# Patient Record
Sex: Female | Born: 2000 | Race: Black or African American | Hispanic: No | Marital: Single | Smoking: Never smoker
Health system: Southern US, Community
[De-identification: ages and names within clinical notes are randomized; demographics above are authoritative.]

## PROBLEM LIST (undated history)

## (undated) ENCOUNTER — Ambulatory Visit: Source: Home / Self Care

## (undated) DIAGNOSIS — Z789 Other specified health status: Secondary | ICD-10-CM

## (undated) HISTORY — PX: WISDOM TOOTH EXTRACTION: SHX21

---

## 2020-07-23 ENCOUNTER — Other Ambulatory Visit: Payer: Self-pay

## 2020-10-21 ENCOUNTER — Inpatient Hospital Stay (HOSPITAL_COMMUNITY)
Admission: AD | Admit: 2020-10-21 | Discharge: 2020-10-21 | Disposition: A | Discharge status: Home / Self Care | Payer: BLUE CROSS/BLUE SHIELD | Attending: Obstetrics & Gynecology | Admitting: Obstetrics & Gynecology

## 2020-10-21 ENCOUNTER — Encounter (HOSPITAL_COMMUNITY): Payer: Self-pay | Admitting: Obstetrics & Gynecology

## 2020-10-21 ENCOUNTER — Inpatient Hospital Stay (HOSPITAL_COMMUNITY): Payer: BLUE CROSS/BLUE SHIELD

## 2020-10-21 ENCOUNTER — Other Ambulatory Visit: Payer: Self-pay

## 2020-10-21 DIAGNOSIS — R109 Unspecified abdominal pain: Secondary | ICD-10-CM | POA: Diagnosis not present

## 2020-10-21 DIAGNOSIS — O26891 Other specified pregnancy related conditions, first trimester: Secondary | ICD-10-CM | POA: Diagnosis not present

## 2020-10-21 DIAGNOSIS — Z3A01 Less than 8 weeks gestation of pregnancy: Secondary | ICD-10-CM | POA: Diagnosis not present

## 2020-10-21 DIAGNOSIS — O209 Hemorrhage in early pregnancy, unspecified: Secondary | ICD-10-CM | POA: Insufficient documentation

## 2020-10-21 DIAGNOSIS — O3680X0 Pregnancy with inconclusive fetal viability, not applicable or unspecified: Secondary | ICD-10-CM | POA: Diagnosis not present

## 2020-10-21 HISTORY — DX: Other specified health status: Z78.9

## 2020-10-21 LAB — URINALYSIS, ROUTINE W REFLEX MICROSCOPIC
Bilirubin Urine: NEGATIVE
Glucose, UA: NEGATIVE mg/dL
Ketones, ur: NEGATIVE mg/dL
Leukocytes,Ua: NEGATIVE
Nitrite: NEGATIVE
Protein, ur: NEGATIVE mg/dL
Specific Gravity, Urine: 1.027 (ref 1.005–1.030)
pH: 5 (ref 5.0–8.0)

## 2020-10-21 LAB — ABO/RH: ABO/RH(D): O POS

## 2020-10-21 LAB — POCT PREGNANCY, URINE: Preg Test, Ur: POSITIVE — AB

## 2020-10-21 LAB — CBC
HCT: 38.3 % (ref 36.0–46.0)
Hemoglobin: 12.8 g/dL (ref 12.0–15.0)
MCH: 29 pg (ref 26.0–34.0)
MCHC: 33.4 g/dL (ref 30.0–36.0)
MCV: 86.7 fL (ref 80.0–100.0)
Platelets: 302 10*3/uL (ref 150–400)
RBC: 4.42 MIL/uL (ref 3.87–5.11)
RDW: 12.7 % (ref 11.5–15.5)
WBC: 6.8 10*3/uL (ref 4.0–10.5)
nRBC: 0 % (ref 0.0–0.2)

## 2020-10-21 LAB — HCG, QUANTITATIVE, PREGNANCY: hCG, Beta Chain, Quant, S: 506 m[IU]/mL — ABNORMAL HIGH (ref <5)

## 2020-10-21 LAB — WET PREP, GENITAL
Clue Cells Wet Prep HPF POC: NONE SEEN
Sperm: NONE SEEN
Trich, Wet Prep: NONE SEEN
Yeast Wet Prep HPF POC: NONE SEEN

## 2020-10-21 LAB — HIV ANTIBODY (ROUTINE TESTING W REFLEX): HIV Screen 4th Generation wRfx: NONREACTIVE

## 2020-10-21 NOTE — MAU Note (Signed)
Presents with c/o lower abdominal pain and VB.  Reports saturating 2 sanitary napkins per day.  LMP approx 09/11/2020.  +HPT.

## 2020-10-21 NOTE — MAU Provider Note (Addendum)
History    Patient Name: Kerry Peters CSN: [id number]  Arrival date and time: 10/21/20 6060   Event Date/Time   First Provider Initiated Contact with Patient 10/21/20 1112      Chief Complaint  Patient presents with   Abdominal Pain   Vaginal Bleeding   HPI Patient is a 20yof G1P0 [redacted]w[redacted]d by LMP presenting with abdominal pain and vaginal bleeding. She states that she has been bleeding since for the past 16 days. She states that the amount of flow has gotten larger and she is now using two pads a day. She has had abdominal pain for the past week. She notes the pain as 4/10 and waxes and wanes. The pain does not radiate. She has taken tylenol for the pain with relief. Denies fever, hematuria, dysuria and vaginal discharge.  OB History     Gravida  1   Para      Term      Preterm      AB      Living         SAB      IAB      Ectopic      Multiple      Live Births              Past Medical History:  Diagnosis Date   Medical history non-contributory     Past Surgical History:  Procedure Laterality Date   WISDOM TOOTH EXTRACTION      Family History  Problem Relation Age of Onset   Hypertension Mother    Liver disease Father     Social History   Tobacco Use   Smoking status: Never Smoker   Smokeless tobacco: Never Used  Vaping Use   Vaping Use: Former  Substance Use Topics   Alcohol use: Not Currently    Comment: Socially   Drug use: Never    Allergies: No Known Allergies  Medications Prior to Admission  Medication Sig Dispense Refill Last Dose   cetirizine (ZYRTEC) 10 MG tablet Take 10 mg by mouth daily.   10/19/2020    Review of Systems  Constitutional: Negative for fever.  Gastrointestinal: Positive for abdominal pain.  Genitourinary: Positive for vaginal bleeding. Negative for dysuria and vaginal discharge.   Physical Exam   Blood pressure 119/80, pulse 70, temperature 97.6 F (36.4 C), temperature source Oral, resp. rate 20, height  5\' 1"  (1.549 m), weight 70.7 kg, last menstrual period 09/11/2020, SpO2 100 %.  Physical Exam Vitals and nursing note reviewed. Exam conducted with a chaperone present.  HENT:     Head: Normocephalic.  Cardiovascular:     Rate and Rhythm: Normal rate and regular rhythm.  Pulmonary:     Effort: Pulmonary effort is normal.  Abdominal:     Tenderness: There is no abdominal tenderness.  Genitourinary:    Cervix: Cervical bleeding present.  Neurological:     General: No focal deficit present.     Mental Status: She is alert.  Psychiatric:        Mood and Affect: Mood normal.        Behavior: Behavior normal.   Pelvic exam: Cervix pink, visually closed, without lesion, small amount dark red bleeding, no fox swabs used to visualize cervix. Vaginal walls and external genitalia normal.    MAU Course  Procedures  09/13/2020 OB LESS THAN 14 WEEKS WITH OB TRANSVAGINAL  Result Date: 10/21/2020 CLINICAL DATA:  Vaginal bleeding EXAM: OBSTETRIC <14 WK 12/21/2020 AND TRANSVAGINAL OB  US TECHNIQUE: Both transabdominal and transvaginal ultrasound examinations were performed for complete evaluation of the gestation as well as the maternal uterus, adnexal regions, and pelvic cul-de-sac. Transvaginal technique was performed to assess early pregnancy. COMPARISON:  None. FINDINGS: Intrauterine gestational sac: None Yolk sac:  Not Visualized. Embryo:  Not Visualized. Cardiac Activity: Not Visualized. Subchorionic hemorrhage:  None visualized. Maternal uterus/adnexae: No adnexal mass. Normal ovaries. Normal uterus. Small amount of pelvic free fluid likely physiologic. IMPRESSION: No intrauterine pregnancy is identified.  Correlate with beta HCG. Electronically Signed   By: Elige Ko   On: 10/21/2020 13:19   Results for orders placed or performed during the hospital encounter of 10/21/20 (from the past 72 hour(s))  Pregnancy, urine POC     Status: Abnormal   Collection Time: 10/21/20 10:12 AM  Result Value Ref Range   Preg  Test, Ur POSITIVE (A) NEGATIVE    Comment:        THE SENSITIVITY OF THIS METHODOLOGY IS >24 mIU/mL   Urinalysis, Routine w reflex microscopic Urine, Clean Catch     Status: Abnormal   Collection Time: 10/21/20 10:16 AM  Result Value Ref Range   Color, Urine YELLOW YELLOW   APPearance CLEAR CLEAR   Specific Gravity, Urine 1.027 1.005 - 1.030   pH 5.0 5.0 - 8.0   Glucose, UA NEGATIVE NEGATIVE mg/dL   Hgb urine dipstick LARGE (A) NEGATIVE   Bilirubin Urine NEGATIVE NEGATIVE   Ketones, ur NEGATIVE NEGATIVE mg/dL   Protein, ur NEGATIVE NEGATIVE mg/dL   Nitrite NEGATIVE NEGATIVE   Leukocytes,Ua NEGATIVE NEGATIVE   RBC / HPF 6-10 0 - 5 RBC/hpf   WBC, UA 0-5 0 - 5 WBC/hpf   Bacteria, UA RARE (A) NONE SEEN   Squamous Epithelial / LPF 0-5 0 - 5   Mucus PRESENT     Comment: Performed at [facility], [address], [place], [place] [address]  ABO/Rh     Status: None   Collection Time: 10/21/20 11:55 AM  Result Value Ref Range   ABO/RH(D) O POS    No rh immune globuloin      NOT A RH IMMUNE GLOBULIN CANDIDATE, PT RH POSITIVE Performed at [facility], [address] [place], [place] [address]   CBC     Status: None   Collection Time: 10/21/20 11:58 AM  Result Value Ref Range   WBC 6.8 4.0 - 10.5 K/uL   RBC 4.42 3.87 - 5.11 MIL/uL   Hemoglobin 12.8 12.0 - 15.0 g/dL   HCT 43.3 29.5 - 18.8 %   MCV 86.7 80.0 - 100.0 fL   MCH 29.0 26.0 - 34.0 pg   MCHC 33.4 30.0 - 36.0 g/dL   RDW 41.6 60.6 - 30.1 %   Platelets 302 150 - 400 K/uL   nRBC 0.0 0.0 - 0.2 %    Comment: Performed at [facility], [address], [place], [place] [address]  hCG, quantitative, pregnancy     Status: Abnormal   Collection Time: 10/21/20 11:58 AM  Result Value Ref Range   hCG, Beta Chain, Quant, S 506 (H) <5 mIU/mL    Comment:          GEST. AGE      CONC.  (mIU/mL)   <=1 WEEK        5 - 50     2 WEEKS       50 - 500     3 WEEKS  100 - 10,000     4 WEEKS     1,000 - 30,000     5  WEEKS     3,500 - 115,000   6-8 WEEKS     12,000 - 270,000    12 WEEKS     15,000 - 220,000        FEMALE AND NON-PREGNANT FEMALE:     LESS THAN 5 mIU/mL Performed at [facility], [address] [place], [place] [address]   Wet prep, genital     Status: Abnormal   Collection Time: 10/21/20 12:00 PM   Specimen: PATH Cytology Cervicovaginal Ancillary Only  Result Value Ref Range   Yeast Wet Prep HPF POC NONE SEEN NONE SEEN   Trich, Wet Prep NONE SEEN NONE SEEN   Clue Cells Wet Prep HPF POC NONE SEEN NONE SEEN   WBC, Wet Prep HPF POC FEW (A) NONE SEEN   Sperm NONE SEEN     Comment: Performed at [facility], [address] [place], [place] [address]    MDM Patient is a 25yof G1P0 [redacted]w[redacted]d by LMP presenting with abdominal pain and vaginal bleeding. She is sitting up in no acute distress. [place] protocol, GC and wet prep ordered. U/S showed no IUP. Beta quant is elevated. Unable to r/o ectopic, IUP or SAB. Pregnancy of unknown location determined. Will need repeat beta quant level in two days to trend.   Assessment and Plan   Pregnancy of Unknown Location  -- Beta quant elevated  -- Rh positive  -- CBC unremarkable  -- U/S: no IUP seen  -- F/u in 2 days for repeat beta quant, expressed the importance of this appt  -- Strict ectopic and SAB return precautions given  -- Tylenol PRN for pain   Patient is agreeable to plan and all questions were answered.   Jeoffrey Massed, PA-Student 10/21/2020, 11:21 AM   CNM attestation:  I have seen and examined this patient; I agree with above documentation in the PA student's note.   Kerry Peters is a 20 y.o. G1P0 reporting vaginal bleeding x 16  Denies VB, cramping, urinary symptoms, vaginal itching/burning.  PE: BP (!) 105/55 (BP Location: Right Arm)   Pulse 66   Temp 97.6 F (36.4 C) (Oral)   Resp 18   Ht 5\' 1"  (1.549 m)   Wt 155 lb 14.4 oz (70.7 kg)   LMP 09/11/2020   SpO2 99%   BMI 29.46 kg/m  Gen: calm  comfortable, NAD Resp: normal effort, no distress Abd: soft, nontender  ROS, labs, PMH reviewed   Plan: D/C home Pregnancy of unknown location, return in 48 hours for repeat hcg, sooner with worsening symptoms Return to MAU as needed for emergencies   09/13/2020, CNM 8:32 PM

## 2020-10-22 LAB — GC/CHLAMYDIA PROBE AMP (~~LOC~~) NOT AT ARMC
Chlamydia: NEGATIVE
Comment: NEGATIVE
Comment: NORMAL
Neisseria Gonorrhea: NEGATIVE

## 2020-10-23 ENCOUNTER — Inpatient Hospital Stay (HOSPITAL_COMMUNITY)
Admission: AD | Admit: 2020-10-23 | Discharge: 2020-10-23 | Disposition: A | Discharge status: Home / Self Care | Payer: BLUE CROSS/BLUE SHIELD | Attending: Obstetrics & Gynecology | Admitting: Obstetrics & Gynecology

## 2020-10-23 ENCOUNTER — Other Ambulatory Visit: Payer: Self-pay

## 2020-10-23 DIAGNOSIS — Z3A01 Less than 8 weeks gestation of pregnancy: Secondary | ICD-10-CM | POA: Diagnosis not present

## 2020-10-23 DIAGNOSIS — O3680X0 Pregnancy with inconclusive fetal viability, not applicable or unspecified: Secondary | ICD-10-CM | POA: Insufficient documentation

## 2020-10-23 LAB — HCG, QUANTITATIVE, PREGNANCY: hCG, Beta Chain, Quant, S: 495 m[IU]/mL — ABNORMAL HIGH (ref <5)

## 2020-10-23 NOTE — MAU Provider Note (Signed)
History   Chief Complaint:  Follow-up   Kerry Peters is  20 y.o. G1P0 Patient's last menstrual period was 09/11/2020.Marland Kitchen Patient is here for follow up of quantitative HCG and ongoing surveillance of pregnancy status. She is [redacted]w[redacted]d weeks gestation  by LMP.    Since her last visit, the patient is without new complaint. The patient reports bleeding as  lighter than period.  She reports improvement in pain since visit 2 days ago.    Her previous Quantitative HCG values are: 506 on 4/6 Ultrasound showed no IUP or adnexal masses.  Physical Exam   Blood pressure 111/62, pulse 76, temperature 98.1 F (36.7 C), temperature source Oral, resp. rate 16, height 5\' 1"  (1.549 m), weight 71 kg, last menstrual period 09/11/2020, SpO2 97 %.  Focused Gynecological Exam: examination not indicated  Physical Examination: General appearance - alert, well appearing, and in no distress Mental status - normal mood, behavior, speech, dress, motor activity, and thought processes Chest - normal respiratory effort  Labs: Results for orders placed or performed during the hospital encounter of 10/23/20 (from the past 24 hour(s))  hCG, quantitative, pregnancy   Collection Time: 10/23/20  1:11 PM  Result Value Ref Range   hCG, Beta Chain, Quant, S 495 (H) <5 mIU/mL    Ultrasound Studies:   12/23/20 OB LESS THAN 14 WEEKS WITH OB TRANSVAGINAL  Result Date: 10/21/2020 CLINICAL DATA:  Vaginal bleeding EXAM: OBSTETRIC <14 WK 12/21/2020 AND TRANSVAGINAL OB US TECHNIQUE: Both transabdominal and transvaginal ultrasound examinations were performed for complete evaluation of the gestation as well as the maternal uterus, adnexal regions, and pelvic cul-de-sac. Transvaginal technique was performed to assess early pregnancy. COMPARISON:  None. FINDINGS: Intrauterine gestational sac: None Yolk sac:  Not Visualized. Embryo:  Not Visualized. Cardiac Activity: Not Visualized. Subchorionic hemorrhage:  None visualized. Maternal uterus/adnexae: No  adnexal mass. Normal ovaries. Normal uterus. Small amount of pelvic free fluid likely physiologic. IMPRESSION: No intrauterine pregnancy is identified.  Correlate with beta HCG. Electronically Signed   By: [place]   On: 10/21/2020 13:19    Assessment:   1. Pregnancy of unknown anatomic location   -inappropriate change in HCG. Reviewed with Dr. 12/21/2020 who recommends repeat HCG on Monday    Plan: -Discharge home in stable condition -SAB vs ectopic precautions discussed -Patient advised to follow-up in MAU for repeat HCG on Monday (can't go to office due to school) -Patient may return to MAU as needed or if her condition were to change or worsen  Monday, NP 10/23/2020, 5:28 PM

## 2020-10-23 NOTE — MAU Note (Signed)
Here for repeat blood work.  Bleeding continues, changes pad 2/day.  Pain has been slowly subsiding.

## 2020-10-23 NOTE — Discharge Instructions (Signed)
Return to care  If you have heavier bleeding that soaks through more that 2 pads per hour for an hour or more If you bleed so much that you feel like you might pass out or you do pass out If you have significant abdominal pain that is not improved with Tylenol   

## 2020-10-26 ENCOUNTER — Inpatient Hospital Stay (HOSPITAL_COMMUNITY): Payer: BLUE CROSS/BLUE SHIELD

## 2020-10-26 ENCOUNTER — Inpatient Hospital Stay (HOSPITAL_COMMUNITY)
Admission: AD | Admit: 2020-10-26 | Discharge: 2020-10-26 | Disposition: A | Discharge status: Home / Self Care | Payer: BLUE CROSS/BLUE SHIELD | Attending: Family Medicine | Admitting: Family Medicine

## 2020-10-26 ENCOUNTER — Other Ambulatory Visit: Payer: Self-pay

## 2020-10-26 DIAGNOSIS — O0281 Inappropriate change in quantitative human chorionic gonadotropin (hCG) in early pregnancy: Secondary | ICD-10-CM | POA: Diagnosis present

## 2020-10-26 DIAGNOSIS — Z3A01 Less than 8 weeks gestation of pregnancy: Secondary | ICD-10-CM | POA: Insufficient documentation

## 2020-10-26 DIAGNOSIS — O009 Unspecified ectopic pregnancy without intrauterine pregnancy: Secondary | ICD-10-CM

## 2020-10-26 LAB — COMPREHENSIVE METABOLIC PANEL
ALT: 19 U/L (ref 0–44)
AST: 21 U/L (ref 15–41)
Albumin: 4.2 g/dL (ref 3.5–5.0)
Alkaline Phosphatase: 62 U/L (ref 38–126)
Anion gap: 5 (ref 5–15)
BUN: 10 mg/dL (ref 6–20)
CO2: 27 mmol/L (ref 22–32)
Calcium: 9.4 mg/dL (ref 8.9–10.3)
Chloride: 103 mmol/L (ref 98–111)
Creatinine, Ser: 0.92 mg/dL (ref 0.44–1.00)
GFR, Estimated: >60 mL/min (ref >60)
Glucose, Bld: 97 mg/dL (ref 70–99)
Potassium: 4.4 mmol/L (ref 3.5–5.1)
Sodium: 135 mmol/L (ref 135–145)
Total Bilirubin: 1.2 mg/dL (ref 0.3–1.2)
Total Protein: 6.8 g/dL (ref 6.5–8.1)

## 2020-10-26 LAB — HCG, QUANTITATIVE, PREGNANCY: hCG, Beta Chain, Quant, S: 366 m[IU]/mL — ABNORMAL HIGH (ref <5)

## 2020-10-26 MED ORDER — METHOTREXATE FOR ECTOPIC PREGNANCY: 50.0000 mg/m2 | Freq: Once | INTRAMUSCULAR | Status: DC

## 2020-10-26 MED ORDER — PROMETHAZINE HCL 25 MG PO TABS
25.0000 mg | ORAL_TABLET | Freq: Four times a day (QID) | ORAL | 0 refills | Status: AC | PRN
Start: 2020-10-26 — End: ?

## 2020-10-26 MED ORDER — METHOTREXATE SODIUM CHEMO INJECTION 50 MG/2ML
50.0000 mg/m2 | Freq: Once | INTRAMUSCULAR | Status: AC
Start: 2020-10-26 — End: 2020-10-26
  Administered 2020-10-26: 87.5 mg via INTRAMUSCULAR
  Filled 2020-10-26: qty 3.5

## 2020-10-26 NOTE — Discharge Instructions (Signed)
Methotrexate Treatment for an Ectopic Pregnancy Methotrexate is a medicine that treats an ectopic pregnancy. In this type of pregnancy, the fertilized egg attaches (implants) outside the uterus. An ectopic pregnancy cannot develop into a healthy baby. Methotrexate works by stopping the growth of the fertilized egg. It also helps the body absorb tissue from the egg. This takes about 2-6 weeks. An ectopic pregnancy can be life-threatening. However, most ectopic pregnancies can be successfully treated with methotrexate if they are diagnosed early. Tell a health care provider about:  Any allergies you have.  All medicines you are taking, including vitamins, herbs, eye drops, creams, and over-the-counter medicines.  Any medical conditions you have. What are the risks? Generally, this is a safe treatment. However, problems may occur, including:  Digestive problems. You may have: ? Nausea. ? Vomiting. ? Diarrhea. ? Cramping in your abdomen.  Bleeding or spotting from your vagina.  Feeling dizzy or light-headed.  Mouth sores.  Inflammation of the lining of your lungs (pneumonitis).  Damage to nearby structures or organs, such as damage to the liver.  Hair loss. There is a risk that methotrexate treatment will fail and the pregnancy will continue. There is also a risk that the ectopic pregnancy might tear or burst (rupture) during use of this medicine. What happens before the procedure?  Blood tests will be done to check how your disease-fighting system (immune system), liver, and kidneys are working.  You will also have blood tests to measure your pregnancy hormone levels and to find out your blood type.  You will be given a shot of a medicine called Rho(D) immune globulin if: ? You are Rh-negative and the father is Rh-positive. ? You are Rh-negative and the father's Rh type is unknown. What happens during the procedure?  Methotrexate will be injected into your  muscle. ? Methotrexate may be given as a single dose of medicine or a series of doses over time, depending on your response to the treatment. ? Methotrexate injections are given by a health care provider. Injection is the most common way that this medicine is used to treat an ectopic pregnancy.  You may also receive other medicines to manage your ectopic pregnancy. The procedure may vary among health care providers and hospitals. What can I expect after treatment? After your treatment, it is common to have:  Cramping in your abdomen.  Bleeding in your vagina.  Tiredness (fatigue).  Nausea.  Vomiting.  Diarrhea. Blood tests will be done at timed intervals for several days or weeks to check your pregnancy hormone levels. The blood tests will be done until the pregnancy hormone can no longer be found in the blood. If the methotrexate treatment does not work, a surgical procedure may be done to remove the ectopic pregnancy. Follow these instructions at home: Medicines  Take over-the-counter and prescription medicines only as told by your health care provider.  Do not take prescription pain medicines, aspirin, ibuprofen, naproxen, or any other NSAIDs.  Do not take folic acid, prenatal vitamins, or other vitamins that contain folic acid. Activity  Do not have sex, douche, or put anything, such as tampons, in your vagina until your health care provider says it is okay.  Limit activities that take a lot of effort as told by your health care provider. General instructions  Do not drink alcohol.  Follow instructions from your health care provider about eating restrictions, such as avoiding foods that produce a lot of gas. These foods can hide the signs of a   ruptured ectopic pregnancy.  Limit exposure to sunlight or artificial UV light such as from tanning beds. Methotrexate can make you more sensitive to the sun.  Follow instructions from your health care provider on how and when to  report any symptoms that may indicate a ruptured ectopic pregnancy.  Keep all follow-up visits. This is important.   Contact a health care provider if:  You have persistent nausea and vomiting.  You have persistent diarrhea.  You are having a reaction to the medicine. This may include: ? Unusual fatigue. ? Skin rash. Get help right away if:  Pain in your abdomen or in the area between your hip bones (pelvic area) gets worse.  You have more bleeding from your vagina.  You feel light-headed or you faint.  You are short of breath.  Your heart rate increases.  You develop a cough.  You have chills or a fever. Summary  Methotrexate is a medicine that treats an ectopic pregnancy. This type of pregnancy forms outside the uterus.  There is a risk that methotrexate treatment will fail and the pregnancy will continue. There is also a risk that the ectopic pregnancy might tear or burst during use of this medicine.  This medicine may be given in a single dose or a series of doses over time.  After your treatment, blood tests will be done at timed intervals for several days or weeks to check your pregnancy hormone levels. The blood tests will be done until no more pregnancy hormone is found in the blood. This information is not intended to replace advice given to you by your health care provider. Make sure you discuss any questions you have with your health care provider. Document Revised: 12/18/2019 Document Reviewed: 12/18/2019 Elsevier Patient Education  2021 Else[employer] Inc.   Ectopic Pregnancy  An ectopic pregnancy happens when a fertilized egg attaches (implants) outside the uterus. In a normal pregnancy, a fertilized egg implants in the uterus. An ectopic pregnancy cannot develop into a healthy baby. Most ectopic pregnancies occur in one of the fallopian tubes, which is where an egg travels from an ovary to get to the uterus. This is called a tubal pregnancy. An ectopic pregnancy  can also happen on an ovary, on the cervix, or in the abdomen. When a fertilized egg implants on tissue outside the uterus and begins to grow, it may cause the tissue to tear or burst. This is known as a ruptured ectopic pregnancy. The tear or burst causes internal bleeding. This may cause intense pain in the abdomen. An ectopic pregnancy is a medical emergency and can be life-threatening. What are the causes? The most common cause of this condition is damage to one of the fallopian tubes. A fallopian tube may be narrowed or blocked, and that stops the fertilized egg from reaching the uterus. Sometimes, the cause of this condition is not known. What increases the risk? The following factors may make you more likely to develop this condition:  Having gone through infertility treatment before.  Having had an ectopic pregnancy before.  Having had surgery to have the fallopian tubes tied.  Becoming pregnant while using an intrauterine device for birth control.  Taking birth control pills before the age of 16. Other risk factors include:  Smoking.  Alcohol use.  History of DES exposure. DES is a medicine that was used until 1971 and affected babies whose mothers took the medicine. What are the signs or symptoms? Common symptoms of this condition include:  Missing  a menstrual period.  Nausea or tiredness.  Tender breasts.  Other normal pregnancy symptoms. Other symptoms may include:  Pain during sex.  Vaginal bleeding or spotting.  Cramping or pain in the lower abdomen.  A fast heartbeat, low blood pressure, and sweating.  Pain or increased pressure while having a bowel movement. Symptoms of a ruptured ectopic pregnancy and internal bleeding may include:  Sudden, severe pain in the abdomen.  Dizziness, weakness, feeling light-headed, or fainting.  Pain in the shoulder or neck area. How is this diagnosed? This condition is diagnosed by:  A blood test to check for the  pregnancy hormone.  A pelvic exam to find painful areas or a mass in the abdomen.  Ultrasound. A probe is inserted into the vagina to see if there is a pregnancy in or outside the uterus.  Taking a sample of tissue from the uterus.  Surgery to look closely at the fallopian tubes through an incision in the abdomen. How is this treated? This condition is usually treated with medicine or surgery. Sometimes, ectopic pregnancies can resolve on their own, under close monitoring by your health care provider. Medicine A medicine called methotrexate may be given to cause the pregnancy tissue to be absorbed. The medicine may be given if:  The diagnosis is made early, with no signs of active bleeding.  The fallopian tube has not torn or burst. You will need blood tests to make sure the medicine is working. It may take 4-6 weeks for the pregnancy tissues to be absorbed. Surgery Surgery may be performed to:  Remove the pregnancy tissue.  Stop internal bleeding.  Remove part or all of the fallopian tube.  Remove the uterus. This is rare. After surgery, you may need to have blood tests to make sure the surgery worked. Follow these instructions at home: Medicines  Take over-the-counter and prescription medicines only as told by your health care provider.  Ask your health care provider if the medicine prescribed to you: ? Requires you to avoid driving or using machinery. ? Can cause constipation. You may need to take these actions to prevent or treat constipation:  Drink enough fluid to keep your urine pale yellow.  Take over-the-counter or prescription medicines.  Eat foods that are high in fiber, such as beans, whole grains, and fresh fruits and vegetables.  Limit foods that are high in fat and processed sugars, such as fried or sweet foods. General instructions  Rest or limit your activity, if told by your health care provider.  Do not have sex or put anything in your vagina, such  as tampons or douches, for 6 weeks or until your health care provider says it is safe.  Do not lift anything that is heavier than 10 lb (4.5 kg), or the limit that you are told, until your health care provider says that it is safe.  Return to your normal activities as told by your health care provider. Ask your health care provider what activities are safe for you.  Keep all follow-up visits. This is important. Contact a health care provider if:  You have a fever or chills.  You have nausea and vomiting. Get help right away if:  Your pain gets worse or is not relieved by medicine.  You feel dizzy or weak.  You feel light-headed or you faint.  You have sudden, severe pain in your abdomen.  You have sudden pain in the shoulder or neck area. Summary  An ectopic pregnancy happens when a  fertilized egg implants outside the uterus. Most ectopic pregnancies occur in one of the fallopian tubes.  An ectopic pregnancy is a medical emergency and can be life-threatening.  The most common cause of this condition is damage to one of the fallopian tubes.  This condition is usually treated with medicine or surgery. Some ectopic pregnancies resolve on their own, under close monitoring by your health care provider. This information is not intended to replace advice given to you by your health care provider. Make sure you discuss any questions you have with your health care provider. Document Revised: 10/15/2019 Document Reviewed: 10/15/2019 Elsevier Patient Education  2021 [employer]vier Inc.  

## 2020-10-26 NOTE — MAU Note (Signed)
Is doing great.  Bleeding has pretty much subsided.  Had some pain this morning on LLQ, lasted an hour or 2, gone now.

## 2020-10-26 NOTE — MAU Provider Note (Signed)
History   Chief Complaint:  Follow-up    Kerry Peters is  20 y.o. G1P0 Patient's last menstrual period was 09/11/2020.Marland Kitchen Patient is here for follow up of quantitative HCG and ongoing surveillance of pregnancy status. She is [redacted]w[redacted]d weeks gestation  by LMP.    Since her last visit, the patient is without new complaint. The patient reports bleeding as  none now.  She denies any pain - she did have an episode of LLQ pain that resolved without intervention.  General ROS:  negative  Her previous Quantitative HCG values are:   Component     Latest Ref Rng & Units 10/21/2020 10/23/2020  HCG, Beta Chain, Quant, S     <5 mIU/mL 506 (H) 495 (H)    Ultrasound on 4/6 showed no IUP or adnexal masses  Physical Exam   Blood pressure 111/70, pulse 61, temperature 98.2 F (36.8 C), temperature source Oral, resp. rate 16, height 5\' 1"  (1.549 m), weight 70.7 kg, last menstrual period 09/11/2020, SpO2 99 %.  Focused Gynecological Exam: examination not indicated  Physical Examination: General appearance - alert, well appearing, and in no distress Mental status - normal mood, behavior, speech, dress, motor activity, and thought processes Chest - normal respiratory effort Abdomen - soft, nontender, nondistended, no masses or organomegaly  Labs: Results for orders placed or performed during the hospital encounter of 10/26/20 (from the past 24 hour(s))  hCG, quantitative, pregnancy   Collection Time: 10/26/20  4:17 PM  Result Value Ref Range   hCG, Beta Chain, Quant, S 366 (H) <5 mIU/mL  Comprehensive metabolic panel   Collection Time: 10/26/20  4:17 PM  Result Value Ref Range   Sodium 135 135 - 145 mmol/L   Potassium 4.4 3.5 - 5.1 mmol/L   Chloride 103 98 - 111 mmol/L   CO2 27 22 - 32 mmol/L   Glucose, Bld 97 70 - 99 mg/dL   BUN 10 6 - 20 mg/dL   Creatinine, Ser 12/26/20 0.44 - 1.00 mg/dL   Calcium 9.4 8.9 - 2.40 mg/dL   Total Protein 6.8 6.5 - 8.1 g/dL   Albumin 4.2 3.5 - 5.0 g/dL   AST 21 15 -  41 U/L   ALT 19 0 - 44 U/L   Alkaline Phosphatase 62 38 - 126 U/L   Total Bilirubin 1.2 0.3 - 1.2 mg/dL   GFR, Estimated 97.3 >53 mL/min   Anion gap 5 5 - 15    Ultrasound Studies:   >29 OB LESS THAN 14 WEEKS WITH OB TRANSVAGINAL  Result Date: 10/21/2020 CLINICAL DATA:  Vaginal bleeding EXAM: OBSTETRIC <14 WK 12/21/2020 AND TRANSVAGINAL OB US TECHNIQUE: Both transabdominal and transvaginal ultrasound examinations were performed for complete evaluation of the gestation as well as the maternal uterus, adnexal regions, and pelvic cul-de-sac. Transvaginal technique was performed to assess early pregnancy. COMPARISON:  None. FINDINGS: Intrauterine gestational sac: None Yolk sac:  Not Visualized. Embryo:  Not Visualized. Cardiac Activity: Not Visualized. Subchorionic hemorrhage:  None visualized. Maternal uterus/adnexae: No adnexal mass. Normal ovaries. Normal uterus. Small amount of pelvic free fluid likely physiologic. IMPRESSION: No intrauterine pregnancy is identified.  Correlate with beta HCG. Electronically Signed   By: [place]   On: 10/21/2020 13:19    Assessment:   1. Inappropriate change in quantitative hCG in early pregnancy    -HCG today down to 366 (was 506 then 495). Ultrasound continues to show no IUP or adnexal mass. Small amount of free fluid. Reviewed with Dr. 12/21/2020  who recommends methotrexate for suspected ectopic pregnancy.   The risks of methotrexate were reviewed including failure requiring repeat dosing or eventual surgery. She understands that methotrexate involves frequent return visits to monitor lab values and that she remains at risk of ectopic rupture until her beta is less than assay. ?The patient opts to proceed with methotrexate.  She has no history of hepatic or renal dysfunction, has normal BUN/Cr/LFT's/platelets.  She is felt to be reliable for follow-up. Side effects of photosensitivity & GI upset were discussed.  She knows to avoid direct sunlight and abstain from  alcohol, aspirin and aspirin-like products for two weeks. She was counseled to discontinue any MVI with folic acid. ?She understands to follow up on D4 (Thursday) and D7 (Sunday) for repeat BHCG and was given the instruction sheet. Strict ectopic precautions were reviewed, the patient knows to call with any abdominal pain, vomiting, fainting, or any concerns with her health.  Rh+, no Rhogam necessary     Plan: -Discharge home in stable condition -ectopic precautions discussed -Patient advised to follow-up for day 4 HCG on Thursday (HCGs in [facility]U due to school schedule) -Patient may return to MAU as needed or if her condition were to change or worsen  Judeth Horn, NP 10/26/2020, 5:25 PM

## 2020-10-29 ENCOUNTER — Other Ambulatory Visit: Payer: Self-pay

## 2020-10-29 ENCOUNTER — Inpatient Hospital Stay (HOSPITAL_COMMUNITY)
Admission: AD | Admit: 2020-10-29 | Discharge: 2020-10-29 | Disposition: A | Discharge status: Home / Self Care | Payer: BLUE CROSS/BLUE SHIELD | Attending: Obstetrics & Gynecology | Admitting: Obstetrics & Gynecology

## 2020-10-29 DIAGNOSIS — Z3A01 Less than 8 weeks gestation of pregnancy: Secondary | ICD-10-CM | POA: Insufficient documentation

## 2020-10-29 DIAGNOSIS — O009 Unspecified ectopic pregnancy without intrauterine pregnancy: Secondary | ICD-10-CM | POA: Insufficient documentation

## 2020-10-29 LAB — HCG, QUANTITATIVE, PREGNANCY: hCG, Beta Chain, Quant, S: 247 m[IU]/mL — ABNORMAL HIGH (ref <5)

## 2020-10-29 NOTE — Discharge Instructions (Signed)
Return to care  If you have heavier bleeding that soaks through more that 2 pads per hour for an hour or more If you bleed so much that you feel like you might pass out or you do pass out If you have significant abdominal pain that is not improved with Tylenol   

## 2020-10-29 NOTE — MAU Provider Note (Signed)
History   Chief Complaint:  HCG Level   Kerry Peters is  20 y.o. G1P0 Patient's last menstrual period was 09/11/2020.Marland Kitchen Patient is here for follow up of quantitative HCG, day 4 s/p methotrexate. She is [redacted]w[redacted]d weeks gestation  by LMP.    Since her last visit, the patient is without new complaint. The patient reports bleeding as  none now.  She denies any pain.  General ROS:  negative  Her previous Quantitative HCG values are:  Component     Latest Ref Rng & Units 10/21/2020 10/23/2020 10/26/2020 MTX given  HCG, Beta Chain, Quant, S     <5 mIU/mL 506 (H) 495 (H) 366 (H)     Physical Exam   Blood pressure 110/66, pulse 69, temperature 98 F (36.7 C), temperature source Oral, resp. rate 18, height 5\' 1"  (1.549 m), weight 71.4 kg, last menstrual period 09/11/2020, SpO2 100 %.  Physical Examination: General appearance - alert, well appearing, and in no distress Mental status - normal mood, behavior, speech, dress, motor activity, and thought processes Lungs- normal respiratory effort   Labs: Results for orders placed or performed during the hospital encounter of 10/29/20 (from the past 24 hour(s))  hCG, quantitative, pregnancy   Collection Time: 10/29/20  4:18 PM  Result Value Ref Range   hCG, Beta Chain, Quant, S 247 (H) <5 mIU/mL      Assessment:   1. Ectopic pregnancy without intrauterine pregnancy, unspecified location    -pt remains asymptomatic -hcg down to 247 today   Plan: -Discharge home in stable condition -ectopic precautions discussed -Patient advised to follow-up with MAU on Sunday for day 7 labs -Patient may return to MAU as needed or if her condition were to change or worsen  Saturday, NP 10/29/2020, 6:02 PM

## 2020-10-29 NOTE — MAU Note (Signed)
Presents for f/u HCG level.  Denies VB and current pain.  States since MTX has one sharp vaginal pain once a day.

## 2020-11-01 ENCOUNTER — Inpatient Hospital Stay (HOSPITAL_COMMUNITY)
Admission: AD | Admit: 2020-11-01 | Discharge: 2020-11-01 | Disposition: A | Discharge status: Home / Self Care | Payer: BLUE CROSS/BLUE SHIELD | Attending: Obstetrics & Gynecology | Admitting: Obstetrics & Gynecology

## 2020-11-01 ENCOUNTER — Other Ambulatory Visit: Payer: Self-pay

## 2020-11-01 ENCOUNTER — Inpatient Hospital Stay (HOSPITAL_COMMUNITY)
Admission: AD | Admit: 2020-11-01 | Discharge: 2020-11-01 | Disposition: A | Discharge status: Home / Self Care | Payer: BLUE CROSS/BLUE SHIELD | Source: Home / Self Care | Attending: Obstetrics & Gynecology | Admitting: Obstetrics & Gynecology

## 2020-11-01 DIAGNOSIS — Z79899 Other long term (current) drug therapy: Secondary | ICD-10-CM | POA: Insufficient documentation

## 2020-11-01 DIAGNOSIS — O3680X0 Pregnancy with inconclusive fetal viability, not applicable or unspecified: Secondary | ICD-10-CM

## 2020-11-01 DIAGNOSIS — Z3A01 Less than 8 weeks gestation of pregnancy: Secondary | ICD-10-CM | POA: Insufficient documentation

## 2020-11-01 DIAGNOSIS — O0281 Inappropriate change in quantitative human chorionic gonadotropin (hCG) in early pregnancy: Secondary | ICD-10-CM | POA: Insufficient documentation

## 2020-11-01 DIAGNOSIS — Z5181 Encounter for therapeutic drug level monitoring: Secondary | ICD-10-CM | POA: Insufficient documentation

## 2020-11-01 DIAGNOSIS — O009 Unspecified ectopic pregnancy without intrauterine pregnancy: Secondary | ICD-10-CM | POA: Diagnosis present

## 2020-11-01 LAB — CBC
HCT: 37.3 % (ref 36.0–46.0)
Hemoglobin: 12.8 g/dL (ref 12.0–15.0)
MCH: 29.8 pg (ref 26.0–34.0)
MCHC: 34.3 g/dL (ref 30.0–36.0)
MCV: 86.9 fL (ref 80.0–100.0)
Platelets: 302 10*3/uL (ref 150–400)
RBC: 4.29 MIL/uL (ref 3.87–5.11)
RDW: 12.8 % (ref 11.5–15.5)
WBC: 9.5 10*3/uL (ref 4.0–10.5)
nRBC: 0 % (ref 0.0–0.2)

## 2020-11-01 LAB — COMPREHENSIVE METABOLIC PANEL
ALT: 23 U/L (ref 0–44)
AST: 19 U/L (ref 15–41)
Albumin: 4.1 g/dL (ref 3.5–5.0)
Alkaline Phosphatase: 60 U/L (ref 38–126)
Anion gap: 5 (ref 5–15)
BUN: 11 mg/dL (ref 6–20)
CO2: 27 mmol/L (ref 22–32)
Calcium: 9.6 mg/dL (ref 8.9–10.3)
Chloride: 106 mmol/L (ref 98–111)
Creatinine, Ser: 1.02 mg/dL — ABNORMAL HIGH (ref 0.44–1.00)
GFR, Estimated: >60 mL/min (ref >60)
Glucose, Bld: 92 mg/dL (ref 70–99)
Potassium: 4.3 mmol/L (ref 3.5–5.1)
Sodium: 138 mmol/L (ref 135–145)
Total Bilirubin: 0.5 mg/dL (ref 0.3–1.2)
Total Protein: 7 g/dL (ref 6.5–8.1)

## 2020-11-01 LAB — HCG, QUANTITATIVE, PREGNANCY: hCG, Beta Chain, Quant, S: 286 m[IU]/mL — ABNORMAL HIGH (ref <5)

## 2020-11-01 MED ORDER — METHOTREXATE FOR ECTOPIC PREGNANCY: 50.0000 mg/m2 | Freq: Once | INTRAMUSCULAR | Status: DC

## 2020-11-01 MED ORDER — METHOTREXATE SODIUM CHEMO INJECTION 50 MG/2ML
50.0000 mg/m2 | Freq: Once | INTRAMUSCULAR | Status: AC
  Administered 2020-11-01: 87.5 mg via INTRAMUSCULAR
  Filled 2020-11-01: qty 3.5

## 2020-11-01 NOTE — Discharge Instructions (Signed)
Ectopic Pregnancy  An ectopic pregnancy happens when a fertilized egg attaches (implants) outside the uterus. In a normal pregnancy, a fertilized egg implants in the uterus. An ectopic pregnancy cannot develop into a healthy baby. Most ectopic pregnancies occur in one of the fallopian tubes, which is where an egg travels from an ovary to get to the uterus. This is called a tubal pregnancy. An ectopic pregnancy can also happen on an ovary, on the cervix, or in the abdomen. When a fertilized egg implants on tissue outside the uterus and begins to grow, it may cause the tissue to tear or burst. This is known as a ruptured ectopic pregnancy. The tear or burst causes internal bleeding. This may cause intense pain in the abdomen. An ectopic pregnancy is a medical emergency and can be life-threatening. What are the causes? The most common cause of this condition is damage to one of the fallopian tubes. A fallopian tube may be narrowed or blocked, and that stops the fertilized egg from reaching the uterus. Sometimes, the cause of this condition is not known. What increases the risk? The following factors may make you more likely to develop this condition:  Having gone through infertility treatment before.  Having had an ectopic pregnancy before.  Having had surgery to have the fallopian tubes tied.  Becoming pregnant while using an intrauterine device for birth control.  Taking birth control pills before the age of 16. Other risk factors include:  Smoking.  Alcohol use.  History of DES exposure. DES is a medicine that was used until 1971 and affected babies whose mothers took the medicine. What are the signs or symptoms? Common symptoms of this condition include:  Missing a menstrual period.  Nausea or tiredness.  Tender breasts.  Other normal pregnancy symptoms. Other symptoms may include:  Pain during sex.  Vaginal bleeding or spotting.  Cramping or pain in the lower abdomen.  A  fast heartbeat, low blood pressure, and sweating.  Pain or increased pressure while having a bowel movement. Symptoms of a ruptured ectopic pregnancy and internal bleeding may include:  Sudden, severe pain in the abdomen.  Dizziness, weakness, feeling light-headed, or fainting.  Pain in the shoulder or neck area. How is this diagnosed? This condition is diagnosed by:  A blood test to check for the pregnancy hormone.  A pelvic exam to find painful areas or a mass in the abdomen.  Ultrasound. A probe is inserted into the vagina to see if there is a pregnancy in or outside the uterus.  Taking a sample of tissue from the uterus.  Surgery to look closely at the fallopian tubes through an incision in the abdomen. How is this treated? This condition is usually treated with medicine or surgery. Sometimes, ectopic pregnancies can resolve on their own, under close monitoring by your health care provider. Medicine A medicine called methotrexate may be given to cause the pregnancy tissue to be absorbed. The medicine may be given if:  The diagnosis is made early, with no signs of active bleeding.  The fallopian tube has not torn or burst. You will need blood tests to make sure the medicine is working. It may take 4-6 weeks for the pregnancy tissues to be absorbed. Surgery Surgery may be performed to:  Remove the pregnancy tissue.  Stop internal bleeding.  Remove part or all of the fallopian tube.  Remove the uterus. This is rare. After surgery, you may need to have blood tests to make sure the surgery worked.   Follow these instructions at home: Medicines  Take over-the-counter and prescription medicines only as told by your health care provider.  Ask your health care provider if the medicine prescribed to you: ? Requires you to avoid driving or using machinery. ? Can cause constipation. You may need to take these actions to prevent or treat constipation:  Drink enough fluid to  keep your urine pale yellow.  Take over-the-counter or prescription medicines.  Eat foods that are high in fiber, such as beans, whole grains, and fresh fruits and vegetables.  Limit foods that are high in fat and processed sugars, such as fried or sweet foods. General instructions  Rest or limit your activity, if told by your health care provider.  Do not have sex or put anything in your vagina, such as tampons or douches, for 6 weeks or until your health care provider says it is safe.  Do not lift anything that is heavier than 10 lb (4.5 kg), or the limit that you are told, until your health care provider says that it is safe.  Return to your normal activities as told by your health care provider. Ask your health care provider what activities are safe for you.  Keep all follow-up visits. This is important. Contact a health care provider if:  You have a fever or chills.  You have nausea and vomiting. Get help right away if:  Your pain gets worse or is not relieved by medicine.  You feel dizzy or weak.  You feel light-headed or you faint.  You have sudden, severe pain in your abdomen.  You have sudden pain in the shoulder or neck area. Summary  An ectopic pregnancy happens when a fertilized egg implants outside the uterus. Most ectopic pregnancies occur in one of the fallopian tubes.  An ectopic pregnancy is a medical emergency and can be life-threatening.  The most common cause of this condition is damage to one of the fallopian tubes.  This condition is usually treated with medicine or surgery. Some ectopic pregnancies resolve on their own, under close monitoring by your health care provider. This information is not intended to replace advice given to you by your health care provider. Make sure you discuss any questions you have with your health care provider. Document Revised: 10/15/2019 Document Reviewed: 10/15/2019 Elsevier Patient Education  2021 Elsevier Inc.  

## 2020-11-01 NOTE — MAU Provider Note (Signed)
History     CSN: [id number]  Arrival date and time: 11/01/20 1325   Event Date/Time   First Provider Initiated Contact with Patient 11/01/20 1345      Chief Complaint  Patient presents with   Follow-up   Kerry Peters is a 20 y.o. G1P0 at [redacted]w[redacted]d who presents today for day #7 S/P MTX. She denies any pain or bleeding at this time. She reports that overall she is feeling very well.   OB History     Gravida  1   Para      Term      Preterm      AB      Living         SAB      IAB      Ectopic      Multiple      Live Births              Past Medical History:  Diagnosis Date   Medical history non-contributory     Past Surgical History:  Procedure Laterality Date   WISDOM TOOTH EXTRACTION      Family History  Problem Relation Age of Onset   Hypertension Mother    Liver disease Father     Social History   Tobacco Use   Smoking status: Never Smoker   Smokeless tobacco: Never Used  Vaping Use   Vaping Use: Former  Substance Use Topics   Alcohol use: Not Currently    Comment: Socially   Drug use: Never    Allergies: No Known Allergies  Medications Prior to Admission  Medication Sig Dispense Refill Last Dose   cetirizine (ZYRTEC) 10 MG tablet Take 10 mg by mouth daily.      promethazine (PHENERGAN) 25 MG tablet Take 1 tablet (25 mg total) by mouth every 6 (six) hours as needed for nausea or vomiting. 30 tablet 0     Review of Systems Physical Exam   Blood pressure 131/66, pulse 75, temperature 98 F (36.7 C), temperature source Oral, resp. rate 16, last menstrual period 09/11/2020, SpO2 100 %.  Physical Exam Vitals and nursing note reviewed.  Constitutional:      General: She is not in acute distress. HENT:     Head: Normocephalic.  Eyes:     Pupils: Pupils are equal, round, and reactive to light.  Cardiovascular:     Rate and Rhythm: Normal rate.  Neurological:     Mental Status: She is alert and oriented to person, place,  and time.  Psychiatric:        Mood and Affect: Mood normal.        Behavior: Behavior normal.   Results for CALISHA, TINDEL (MRN [id number]) as of 11/01/2020 16:15  Ref. Range 10/26/2020 16:17 10/26/2020 18:43 10/29/2020 16:18 11/01/2020 13:34  HCG, Beta Chain, Quant, S Latest Ref Range: <5 mIU/mL 366 (H)  247 (H) 286 (H)    MAU Course  Procedures  MDM 1625: called patient and verified identity with 2 identifiers. Reviewed that HCG has gone up a little since day 4. I reviewed with Dr. Macon Large and she recommends that patient return for second MTX injection. Patient verbalizes understanding and states that she will return for MTX injection.   Assessment and Plan   1. Ectopic pregnancy without intrauterine pregnancy, unspecified location   2. Encounter for monitoring of methotrexate therapy    DC home Comfort measures reviewed  1st/2nd/3rd Trimester precautions  Bleeding precautions Ectopic precautions PTL  precautions  Fetal kick counts RX: Return to MAU as needed FU with OB as planned   Follow-up Information     [facility] Follow up.   Specialty: Obstetrics and Gynecology Contact information: [address] [place] [address] [phone]               Thressa Sheller DNP, CNM  11/01/20  1:57 PM

## 2020-11-01 NOTE — MAU Note (Signed)
Pt reports to mau for 2nd dose of mtx.  Denies any complaints at this time.

## 2020-11-01 NOTE — MAU Note (Signed)
Pt reports to mau for follow up lab draw.  Denies any pain or bleeding today.

## 2020-11-01 NOTE — MAU Provider Note (Signed)
History     CSN: [id number]  Arrival date and time: 11/01/20 1652   None     Chief Complaint  Patient presents with  . Follow-up   Kerry Peters is a 20 y.o. G1P0 at [redacted]w[redacted]d who presents today for MTX. She was here earlier today for day #4 labs and then DC home awaiting results. When results were available it was noted that patient's hcg had gone up slightly from day #4. She continues to feel well. Denies any pain or bleeding at this time. She is agreeable to 2nd dose of MTX as discussed on the phone.    OB History    Gravida  1   Para      Term      Preterm      AB      Living        SAB      IAB      Ectopic      Multiple      Live Births              Past Medical History:  Diagnosis Date  . Medical history non-contributory     Past Surgical History:  Procedure Laterality Date  . WISDOM TOOTH EXTRACTION      Family History  Problem Relation Age of Onset  . Hypertension Mother   . Liver disease Father     Social History   Tobacco Use  . Smoking status: Never Smoker  . Smokeless tobacco: Never Used  Vaping Use  . Vaping Use: Former  Substance Use Topics  . Alcohol use: Not Currently    Comment: Socially  . Drug use: Never    Allergies: No Known Allergies  Medications Prior to Admission  Medication Sig Dispense Refill Last Dose  . cetirizine (ZYRTEC) 10 MG tablet Take 10 mg by mouth daily.     . promethazine (PHENERGAN) 25 MG tablet Take 1 tablet (25 mg total) by mouth every 6 (six) hours as needed for nausea or vomiting. 30 tablet 0     Review of Systems  All other systems reviewed and are negative.  Physical Exam   Blood pressure 114/80, pulse 71, temperature 98.2 F (36.8 C), temperature source Oral, resp. rate 15, height 5\' 1"  (1.549 m), weight 70.8 kg, last menstrual period 09/11/2020, SpO2 99 %.  Physical Exam Vitals and nursing note reviewed.  Constitutional:      General: She is not in acute distress. HENT:      Head: Normocephalic.  Eyes:     Pupils: Pupils are equal, round, and reactive to light.  Cardiovascular:     Rate and Rhythm: Normal rate.  Pulmonary:     Effort: Pulmonary effort is normal.  Skin:    General: Skin is warm and dry.  Neurological:     Mental Status: She is alert and oriented to person, place, and time.  Psychiatric:        Mood and Affect: Mood normal.        Behavior: Behavior normal.    Results for orders placed or performed during the hospital encounter of 11/01/20 (from the past 24 hour(s))  CBC     Status: None   Collection Time: 11/01/20  5:11 PM  Result Value Ref Range   WBC 9.5 4.0 - 10.5 K/uL   RBC 4.29 3.87 - 5.11 MIL/uL   Hemoglobin 12.8 12.0 - 15.0 g/dL   HCT 11/03/20 82.9 - 56.2 %   MCV 86.9  80.0 - 100.0 fL   MCH 29.8 26.0 - 34.0 pg   MCHC 34.3 30.0 - 36.0 g/dL   RDW 02.5 42.7 - 06.2 %   Platelets 302 150 - 400 K/uL   nRBC 0.0 0.0 - 0.2 %  Comprehensive metabolic panel     Status: Abnormal   Collection Time: 11/01/20  5:11 PM  Result Value Ref Range   Sodium 138 135 - 145 mmol/L   Potassium 4.3 3.5 - 5.1 mmol/L   Chloride 106 98 - 111 mmol/L   CO2 27 22 - 32 mmol/L   Glucose, Bld 92 70 - 99 mg/dL   BUN 11 6 - 20 mg/dL   Creatinine, Ser 3.76 (H) 0.44 - 1.00 mg/dL   Calcium 9.6 8.9 - 28.3 mg/dL   Total Protein 7.0 6.5 - 8.1 g/dL   Albumin 4.1 3.5 - 5.0 g/dL   AST 19 15 - 41 U/L   ALT 23 0 - 44 U/L   Alkaline Phosphatase 60 38 - 126 U/L   Total Bilirubin 0.5 0.3 - 1.2 mg/dL   GFR, Estimated >15 >17 mL/min   Anion gap 5 5 - 15  Results for JADELIN, ENG (MRN [id number]) as of 11/01/2020 18:04  Ref. Range 10/26/2020 16:17 10/26/2020 18:43 10/29/2020 16:18 11/01/2020 13:34  HCG, Beta Chain, Quant, S Latest Ref Range: <5 mIU/mL 366 (H)  247 (H) 286 (H)    MAU Course  Procedures  MDM CW Dr. Macon Large regarding creatinine of 1.02 today. Ok to proceed with MTX at this time.    Assessment and Plan   1. Pregnancy of unknown anatomic location    2. Inappropriate change in quantitative hCG in early pregnancy   3. Encounter for methotrexate monitoring     DC home 1stTrimester precautions  Bleeding precautions Ectopic precautions RX: none  Return to MAU as needed FU with OB as planned   Follow-up Information    [facility] Follow up.   Specialty: Obstetrics and Gynecology Contact information: [address] [place] [address] [phone]             Thressa Sheller DNP, CNM  11/01/20  7:11 PM

## 2020-11-04 ENCOUNTER — Encounter: Payer: Self-pay | Admitting: Family Medicine

## 2020-11-04 ENCOUNTER — Ambulatory Visit (INDEPENDENT_AMBULATORY_CARE_PROVIDER_SITE_OTHER): Payer: BLUE CROSS/BLUE SHIELD

## 2020-11-04 VITALS — BP 135/88 | HR 92 | Wt 156.1 lb

## 2020-11-04 DIAGNOSIS — O009 Unspecified ectopic pregnancy without intrauterine pregnancy: Secondary | ICD-10-CM | POA: Diagnosis not present

## 2020-11-04 LAB — BETA HCG QUANT (REF LAB): hCG Quant: 129 m[IU]/mL

## 2020-11-04 NOTE — Progress Notes (Signed)
Pt here today for STAT beta s/p day 4 second dose of MTX.  Pt reports vaginal bleeding only when she wipes and no pain.  Pt informs me that she is aware of the process as this is her second dose.  I explained to the pt that I will call by end of day today with results and that she will continue to f/u at MAU for day 7 beta draw as the office is closed on the weekend.  Pt verbalized understanding with no further questions.   Received notification from [facility] that pt's beta results are 129.  Reviewed results with Dr. Shawnie Pons who recommends that pt continue with day 7 MTX blood draw.  Informed pt provider's recommendation and because day 7 is on Saturday if she could please go to MAU for that STAT Beta lab draw.  Pt verbalized understanding with no further questions.  MAU charge notified of pt's need for day 7 STAT beta.    Addison Naegeli, RN  11/04/20

## 2020-11-05 NOTE — Progress Notes (Signed)
Patient seen and assessed by nursing staff.  Agree with documentation and plan.  

## 2020-11-07 ENCOUNTER — Other Ambulatory Visit: Payer: Self-pay

## 2020-11-07 ENCOUNTER — Inpatient Hospital Stay (HOSPITAL_COMMUNITY)
Admission: AD | Admit: 2020-11-07 | Discharge: 2020-11-07 | Disposition: A | Discharge status: Home / Self Care | Payer: BLUE CROSS/BLUE SHIELD | Attending: Obstetrics and Gynecology | Admitting: Obstetrics and Gynecology

## 2020-11-07 DIAGNOSIS — O00109 Unspecified tubal pregnancy without intrauterine pregnancy: Secondary | ICD-10-CM | POA: Diagnosis not present

## 2020-11-07 DIAGNOSIS — Z3A08 8 weeks gestation of pregnancy: Secondary | ICD-10-CM | POA: Diagnosis not present

## 2020-11-07 DIAGNOSIS — O009 Unspecified ectopic pregnancy without intrauterine pregnancy: Secondary | ICD-10-CM | POA: Diagnosis present

## 2020-11-07 LAB — HCG, QUANTITATIVE, PREGNANCY: hCG, Beta Chain, Quant, S: 62 m[IU]/mL — ABNORMAL HIGH (ref <5)

## 2020-11-07 NOTE — MAU Note (Addendum)
Presents for follow up blood work, HCG level.  Denies pain, has started menstrual cycle.

## 2020-11-07 NOTE — Discharge Instructions (Signed)
Methotrexate Treatment for an Ectopic Pregnancy Methotrexate is a medicine that treats an ectopic pregnancy. In this type of pregnancy, the fertilized egg attaches (implants) outside the uterus. An ectopic pregnancy cannot develop into a healthy baby. Methotrexate works by stopping the growth of the fertilized egg. It also helps the body absorb tissue from the egg. This takes about 2-6 weeks. An ectopic pregnancy can be life-threatening. However, most ectopic pregnancies can be successfully treated with methotrexate if they are diagnosed early. Tell a health care provider about:  Any allergies you have.  All medicines you are taking, including vitamins, herbs, eye drops, creams, and over-the-counter medicines.  Any medical conditions you have. What are the risks? Generally, this is a safe treatment. However, problems may occur, including:  Digestive problems. You may have: ? Nausea. ? Vomiting. ? Diarrhea. ? Cramping in your abdomen.  Bleeding or spotting from your vagina.  Feeling dizzy or light-headed.  Mouth sores.  Inflammation of the lining of your lungs (pneumonitis).  Damage to nearby structures or organs, such as damage to the liver.  Hair loss. There is a risk that methotrexate treatment will fail and the pregnancy will continue. There is also a risk that the ectopic pregnancy might tear or burst (rupture) during use of this medicine. What happens before the procedure?  Blood tests will be done to check how your disease-fighting system (immune system), liver, and kidneys are working.  You will also have blood tests to measure your pregnancy hormone levels and to find out your blood type.  You will be given a shot of a medicine called Rho(D) immune globulin if: ? You are Rh-negative and the father is Rh-positive. ? You are Rh-negative and the father's Rh type is unknown. What happens during the procedure?  Methotrexate will be injected into your  muscle. ? Methotrexate may be given as a single dose of medicine or a series of doses over time, depending on your response to the treatment. ? Methotrexate injections are given by a health care provider. Injection is the most common way that this medicine is used to treat an ectopic pregnancy.  You may also receive other medicines to manage your ectopic pregnancy. The procedure may vary among health care providers and hospitals. What can I expect after treatment? After your treatment, it is common to have:  Cramping in your abdomen.  Bleeding in your vagina.  Tiredness (fatigue).  Nausea.  Vomiting.  Diarrhea. Blood tests will be done at timed intervals for several days or weeks to check your pregnancy hormone levels. The blood tests will be done until the pregnancy hormone can no longer be found in the blood. If the methotrexate treatment does not work, a surgical procedure may be done to remove the ectopic pregnancy. Follow these instructions at home: Medicines  Take over-the-counter and prescription medicines only as told by your health care provider.  Do not take prescription pain medicines, aspirin, ibuprofen, naproxen, or any other NSAIDs.  Do not take folic acid, prenatal vitamins, or other vitamins that contain folic acid. Activity  Do not have sex, douche, or put anything, such as tampons, in your vagina until your health care provider says it is okay.  Limit activities that take a lot of effort as told by your health care provider. General instructions  Do not drink alcohol.  Follow instructions from your health care provider about eating restrictions, such as avoiding foods that produce a lot of gas. These foods can hide the signs of a   ruptured ectopic pregnancy.  Limit exposure to sunlight or artificial UV light such as from tanning beds. Methotrexate can make you more sensitive to the sun.  Follow instructions from your health care provider on how and when to  report any symptoms that may indicate a ruptured ectopic pregnancy.  Keep all follow-up visits. This is important.   Contact a health care provider if:  You have persistent nausea and vomiting.  You have persistent diarrhea.  You are having a reaction to the medicine. This may include: ? Unusual fatigue. ? Skin rash. Get help right away if:  Pain in your abdomen or in the area between your hip bones (pelvic area) gets worse.  You have more bleeding from your vagina.  You feel light-headed or you faint.  You are short of breath.  Your heart rate increases.  You develop a cough.  You have chills or a fever. Summary  Methotrexate is a medicine that treats an ectopic pregnancy. This type of pregnancy forms outside the uterus.  There is a risk that methotrexate treatment will fail and the pregnancy will continue. There is also a risk that the ectopic pregnancy might tear or burst during use of this medicine.  This medicine may be given in a single dose or a series of doses over time.  After your treatment, blood tests will be done at timed intervals for several days or weeks to check your pregnancy hormone levels. The blood tests will be done until no more pregnancy hormone is found in the blood. This information is not intended to replace advice given to you by your health care provider. Make sure you discuss any questions you have with your health care provider. Document Revised: 12/18/2019 Document Reviewed: 12/18/2019 Elsevier Patient Education  2021 [employer]  

## 2020-11-07 NOTE — [facility] Provider Note (Signed)
Subjective:  Kerry Peters is a 19 y.o. G1P0 at [redacted]w[redacted]d who presents today for FU BHCG; she is status post MTX x 2 doses for ectopic pregnancy.  She reports vaginal bleeding. She denies abdominal or pelvic pain.   Dose 1 MTX 4/11 hcg 366 Day 4 Dose 1 HCG 4/14: 247 Day 7 Dose 1 HCG 4/17: 286  Dose 2 MTX:  4/17 Quant: 286 Day 4 Dose 2 HCG 4/20: 129 Day 7 Dose 2 HCG 4/23: 62   Objective:  Physical Exam  Nursing note and vitals reviewed. Constitutional: She is oriented to person, place, and time. She appears well-developed and well-nourished. No distress.  HENT:  Head: Normocephalic.  Cardiovascular: Normal rate.  Respiratory: Effort normal.  GI: Soft. There is no tenderness.  Neurological: She is alert and oriented to person, place, and time. Skin: Skin is warm and dry.  Psychiatric: She has a normal mood and affect.   Results for orders placed or performed during the hospital encounter of 11/07/20 (from the past 24 hour(s))  hCG, quantitative, pregnancy     Status: Abnormal   Collection Time: 11/07/20  1:15 PM  Result Value Ref Range   hCG, Beta Chain, Quant, S 62 (H) <5 mIU/mL    Assessment/Plan: Pregnancy of unknown location Ectopic pregnancy  Quant down today and she is without pain.  F/u weekly until quant reaches zero. Message sent to the office to schedule. Ectopic precautions  Pelvic rest   Zachary Lovins, Harolyn Rutherford, NP 11/07/2020 2:47 PM

## 2020-11-17 ENCOUNTER — Telehealth: Payer: Self-pay | Admitting: Family Medicine

## 2020-11-17 NOTE — Telephone Encounter (Signed)
Called pt no voicemail setup, no answer

## 2021-08-23 IMAGING — US US OB < 14 WEEKS - US OB TV
1 series · 15 of 28 positions shown · non-contrast
Comparison: None.

CLINICAL DATA: Vaginal bleeding

EXAM:
OBSTETRIC <14 WK US AND TRANSVAGINAL OB US
TECHNIQUE: Both transabdominal and transvaginal ultrasound examinations were
performed for complete evaluation of the gestation as well as the
maternal uterus, adnexal regions, and pelvic cul-de-sac.
Transvaginal technique was performed to assess early pregnancy.

[Series 1: us ob < 14 weeks - us ob tv · 45 acquisitions, 15 frames shown]
[im 1/45]
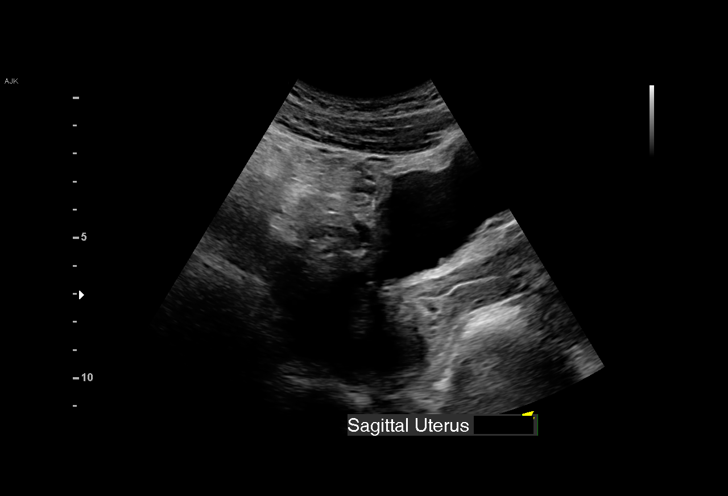
[im 4/45]
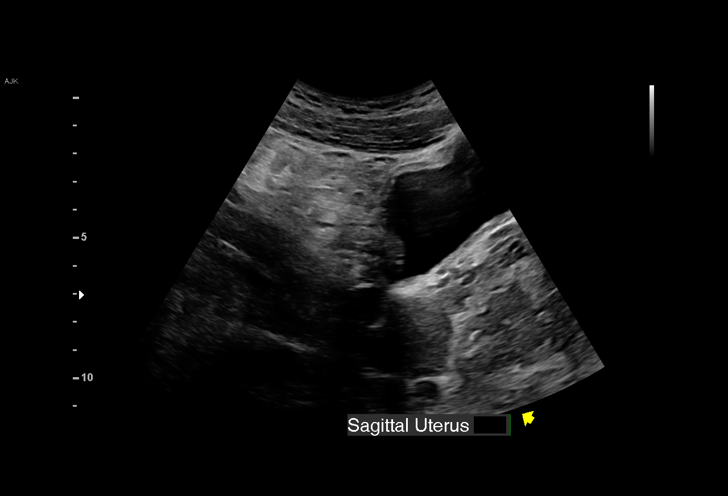
[im 7/45]
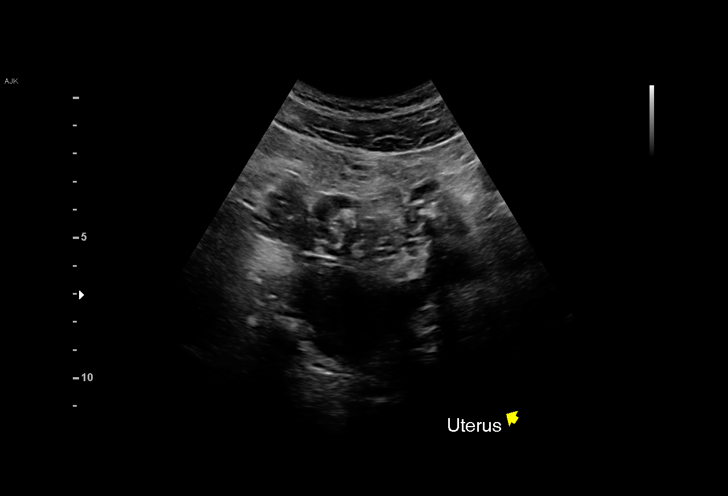
[im 10/45]
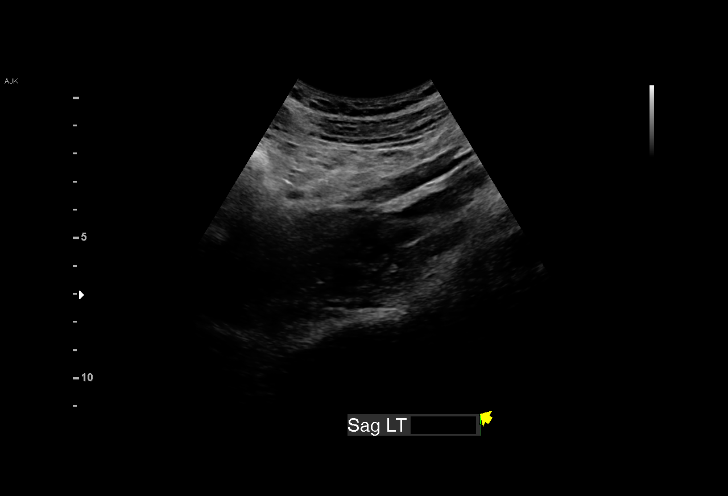
[im 14/45]
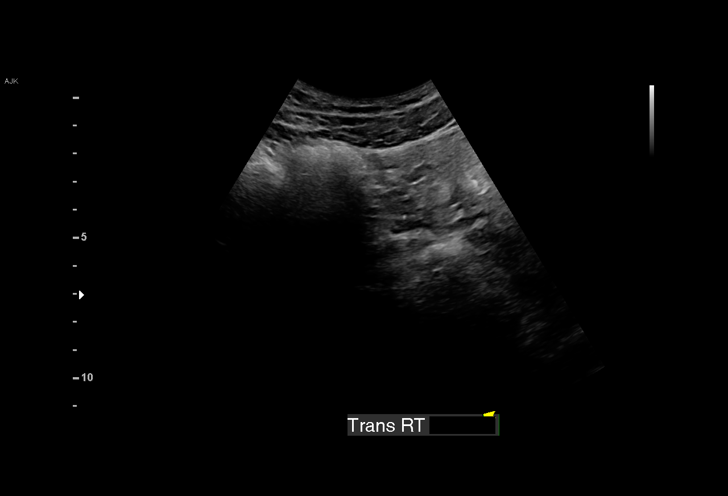
[im 17/45]
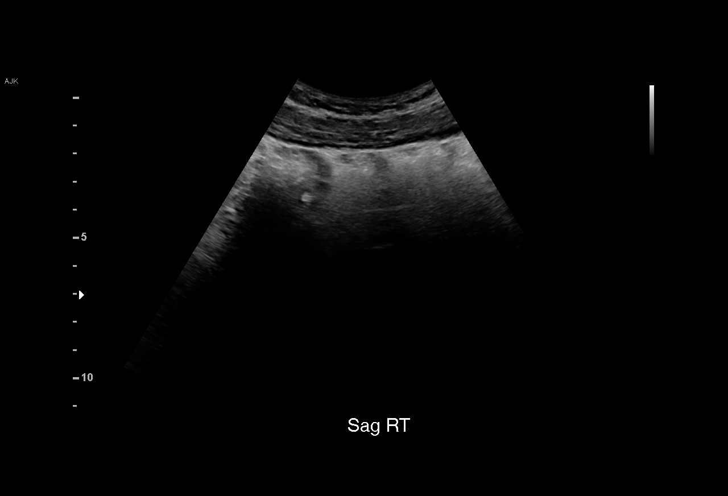
[im 20/45]
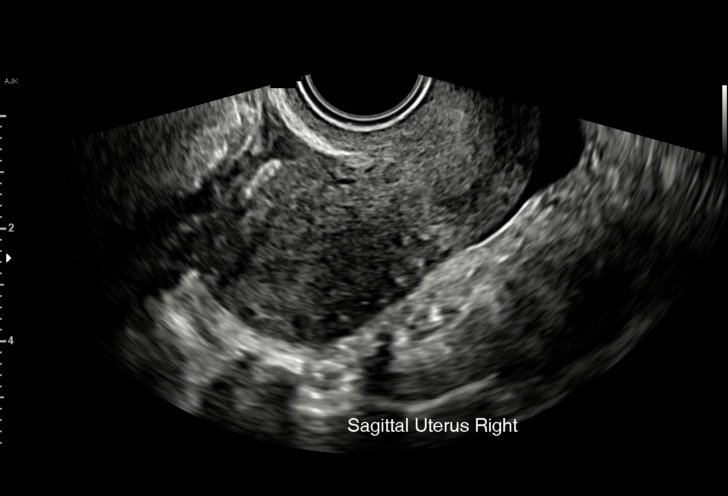
[im 23/45]
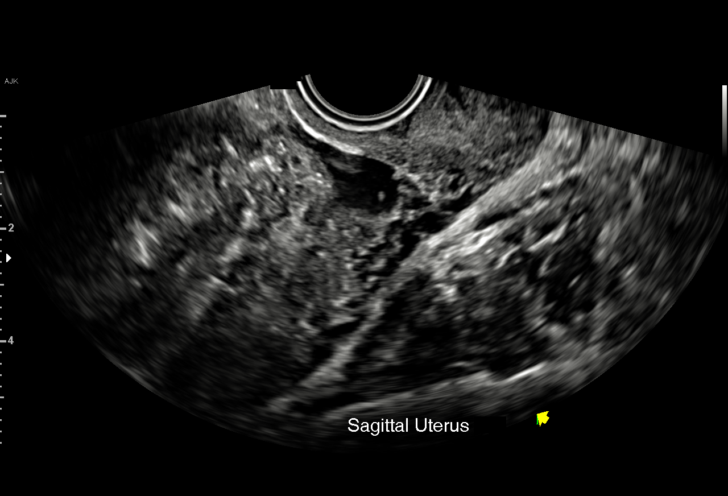
[im 25/45]
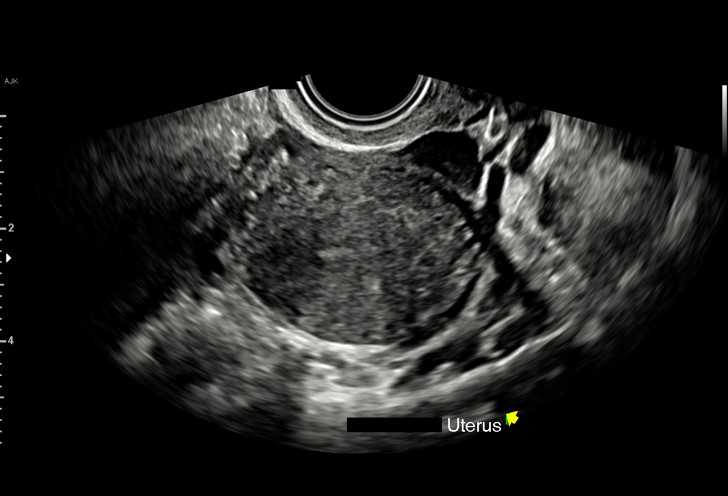
[im 28/45]
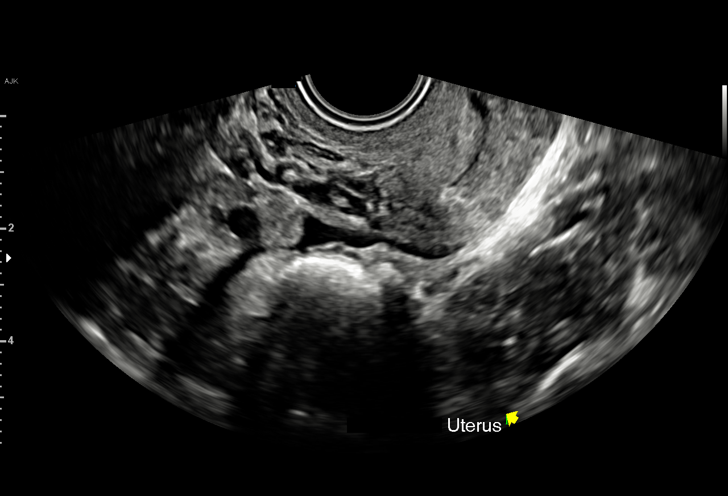
[im 31/45]
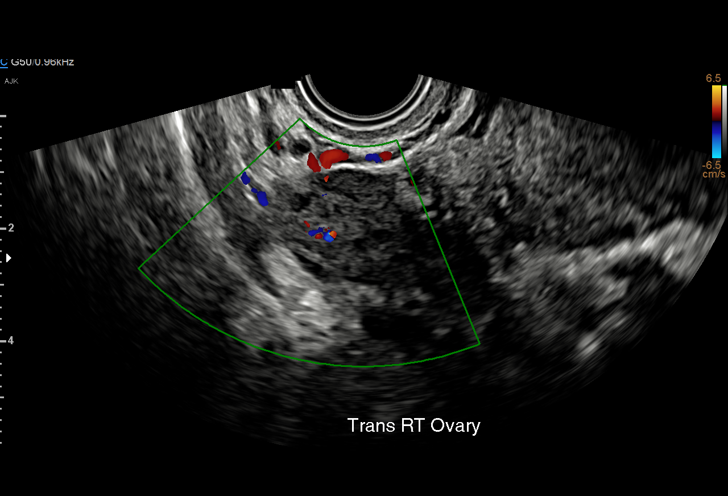
[im 35/45]
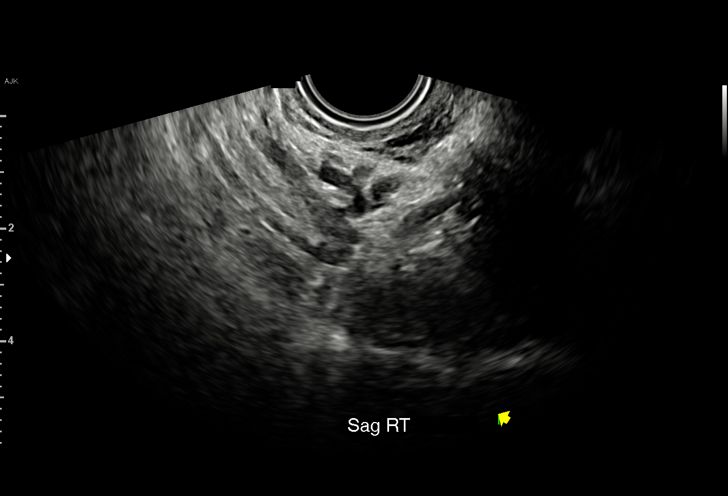
[im 38/45]
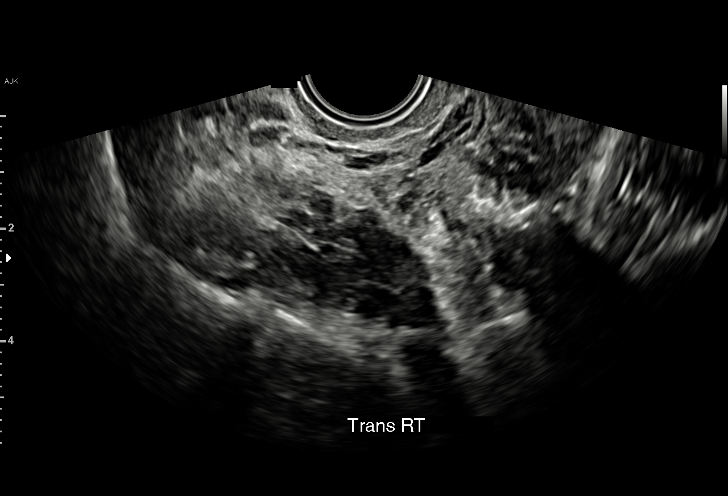
[im 41/45]
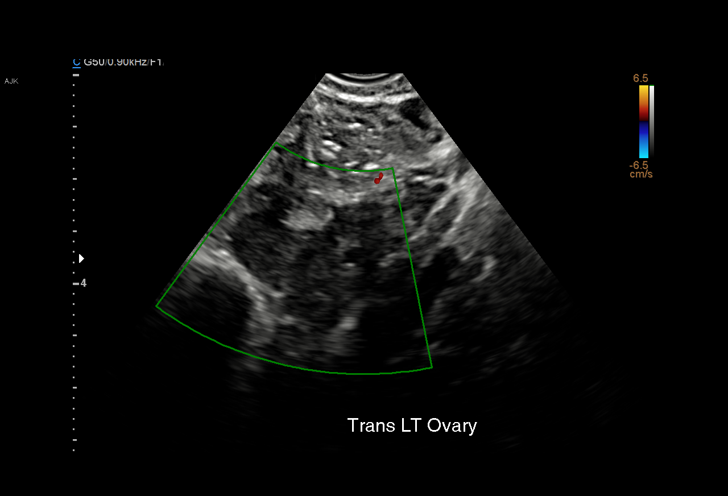
[im 45/45]
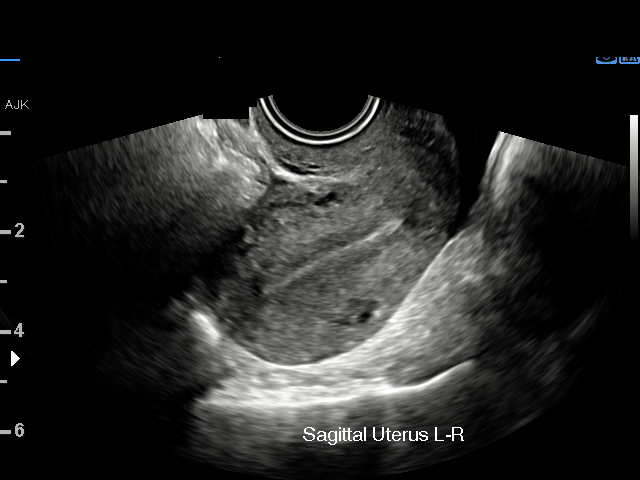

[15 of 28 positions shown; findings below may reference images not displayed]

FINDINGS: Intrauterine gestational sac: None

Yolk sac:  Not Visualized.

Embryo:  Not Visualized.

Cardiac Activity: Not Visualized.

Subchorionic hemorrhage:  None visualized.

Maternal uterus/adnexae: No adnexal mass. Normal ovaries. Normal
uterus. Small amount of pelvic free fluid likely physiologic.
IMPRESSION: No intrauterine pregnancy is identified.  Correlate with beta HCG.

## 2021-08-28 IMAGING — US US OB TRANSVAGINAL
1 series · 15 of 28 positions shown · non-contrast
Comparison: 10/21/2020

CLINICAL DATA: Change in quant

EXAM:
TRANSVAGINAL OB ULTRASOUND
TECHNIQUE: Transvaginal ultrasound was performed for complete evaluation of the
gestation as well as the maternal uterus, adnexal regions, and
pelvic cul-de-sac.

[Series 1: us ob transvaginal · 36 acquisitions, 15 frames shown]
[im 1/36]
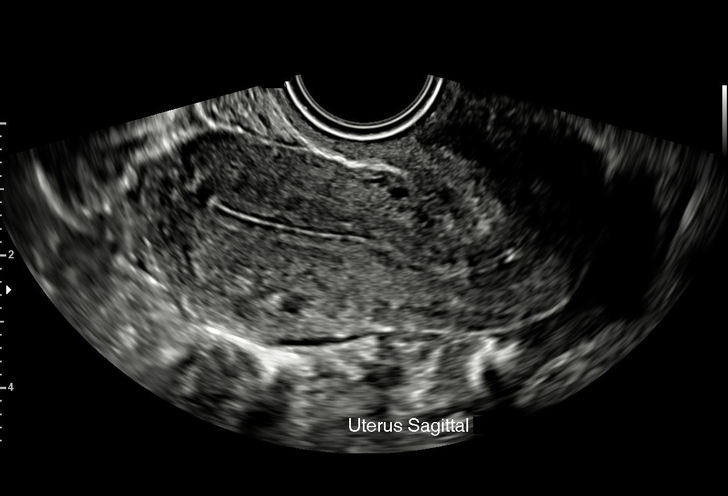
[im 3/36]
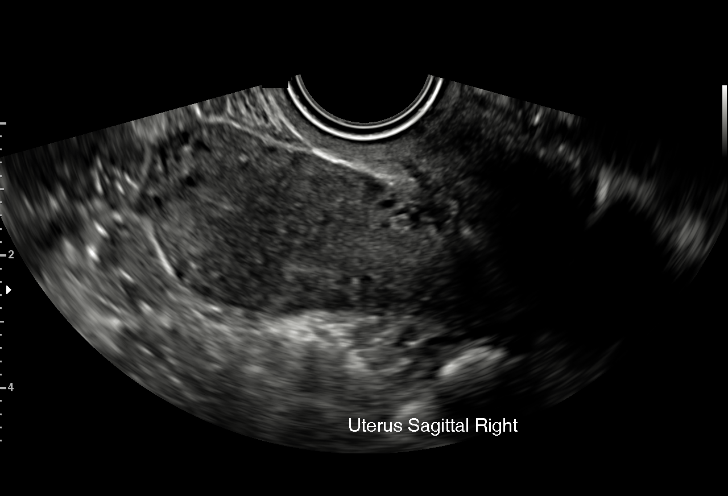
[im 6/36]
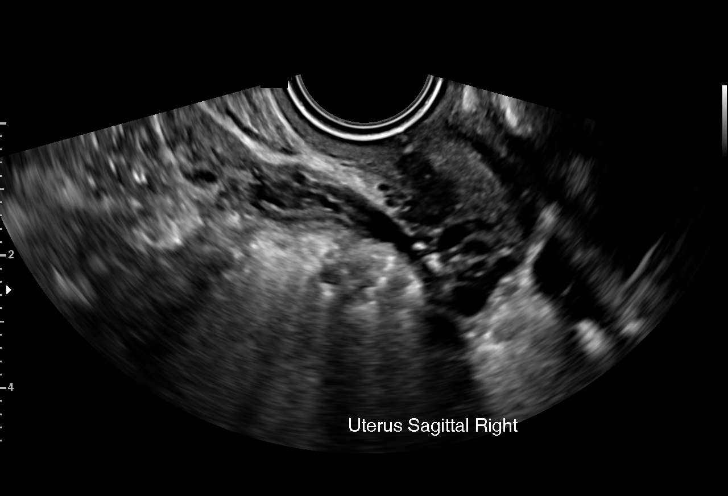
[im 8/36]
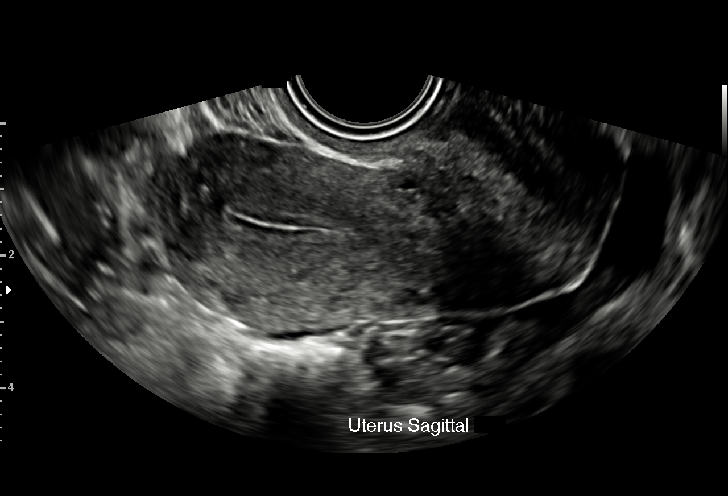
[im 11/36]
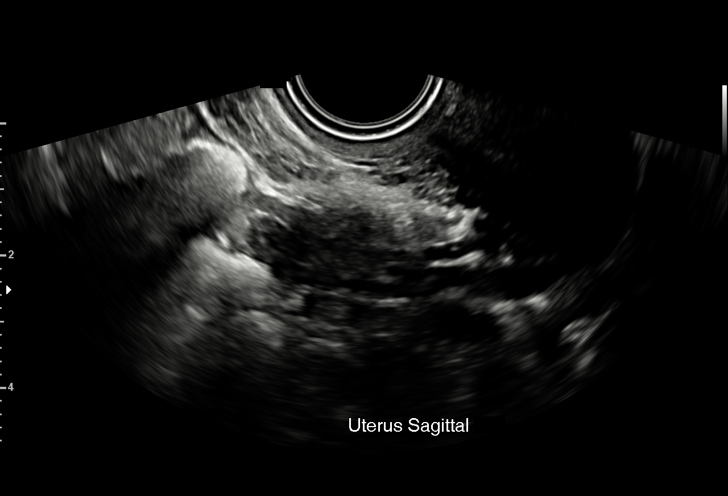
[im 13/36]
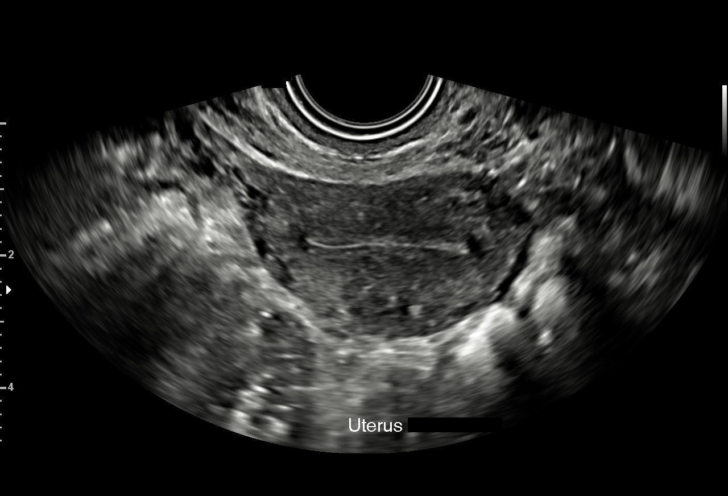
[im 16/36]
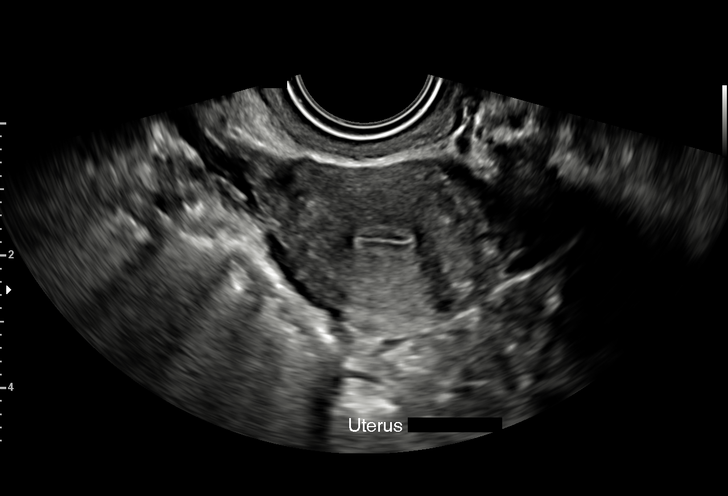
[im 19/36]
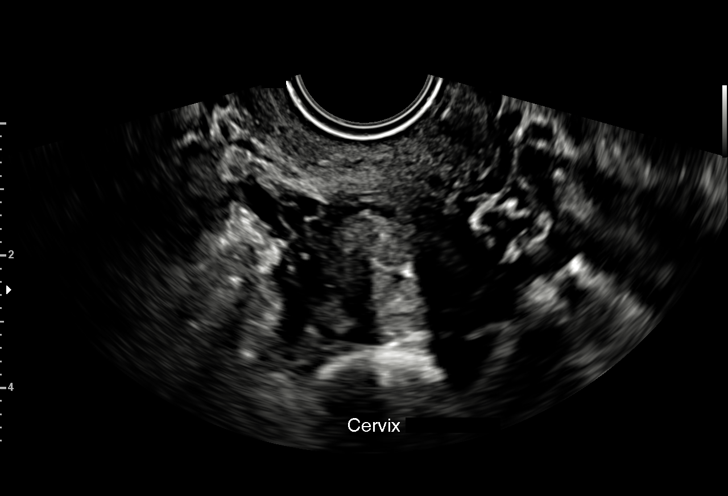
[im 20/36]
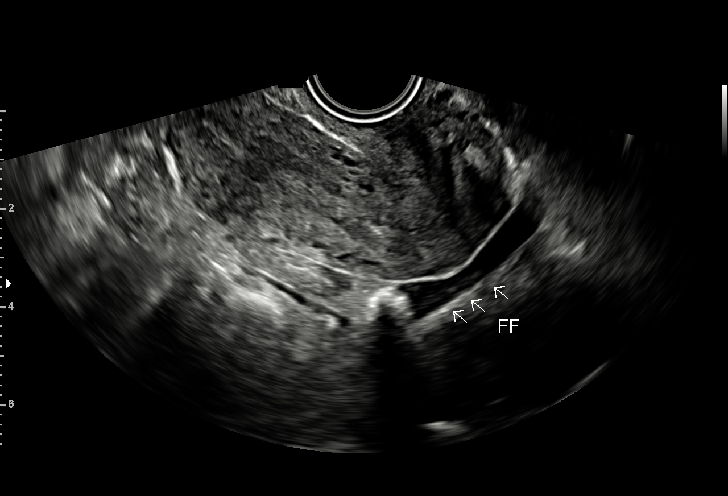
[im 23/36]
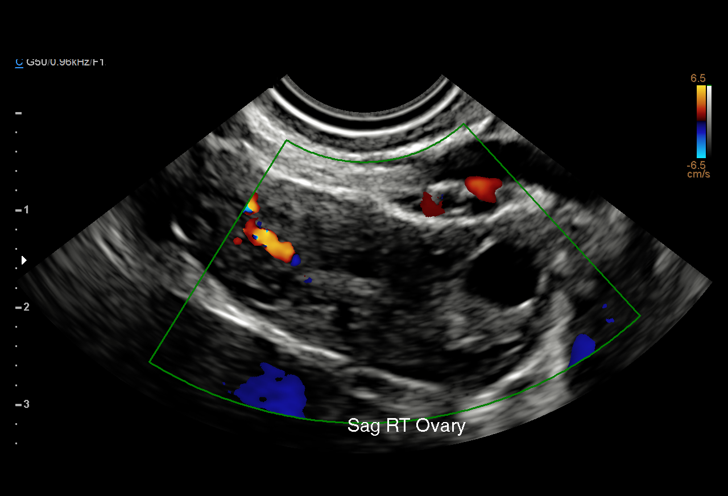
[im 25/36]
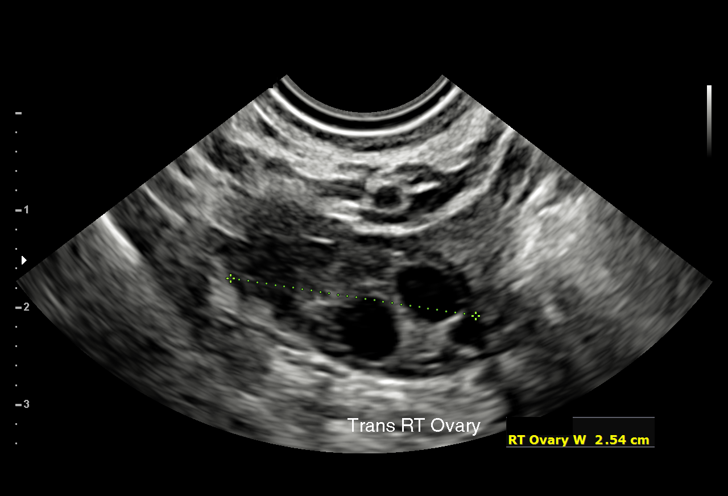
[im 28/36]
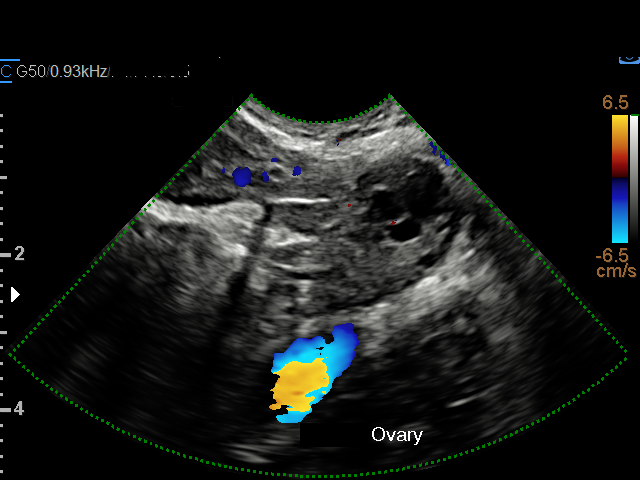
[im 30/36]
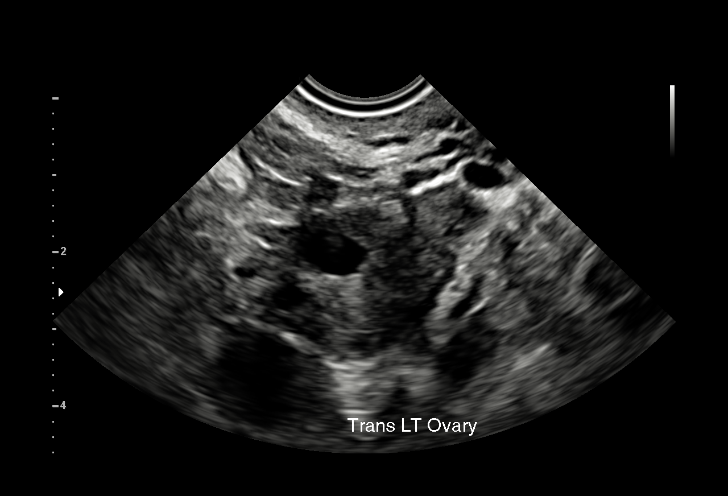
[im 33/36]
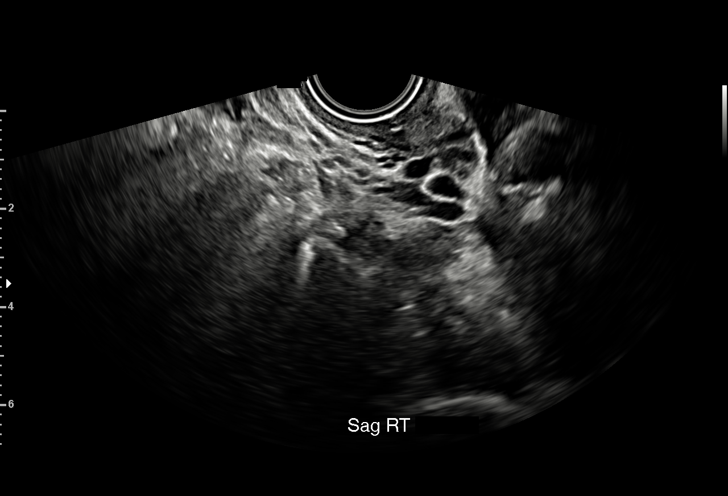
[im 36/36]
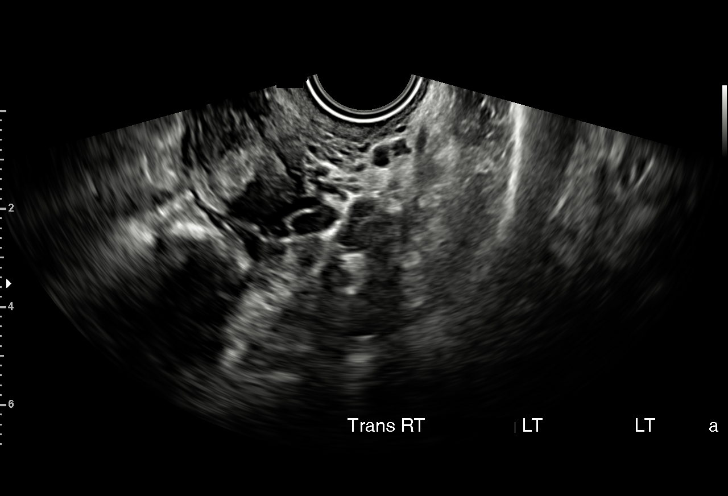

[15 of 28 positions shown; findings below may reference images not displayed]

FINDINGS: Intrauterine gestational sac: None

Yolk sac:  Not Visualized.

Embryo:  Not Visualized.

Maternal uterus/adnexae: Ovaries are within normal limits. Right
ovary measures 4 x 1.7 x 2.5 cm. Left ovary measures 3.7 x 1.7 x
cm. Small free fluid in the pelvis.
IMPRESSION: No IUP identified. Findings consistent with pregnancy of unknown
location, differential of which includes recent failed pregnancy,
occult ectopic pregnancy, and IUP too early to visualize (less
likely given decreasing quantitative HCG). Follow-up ultrasound as
clinically indicated. Small free fluid in the pelvis.

## 2024-06-26 ENCOUNTER — Ambulatory Visit

## 2024-06-27 ENCOUNTER — Ambulatory Visit (HOSPITAL_COMMUNITY): Admission: EM | Admit: 2024-06-27 | Discharge: 2024-06-27 | Disposition: A | Discharge status: Home / Self Care

## 2024-06-27 ENCOUNTER — Encounter (HOSPITAL_COMMUNITY): Payer: Self-pay

## 2024-06-27 DIAGNOSIS — S39012A Strain of muscle, fascia and tendon of lower back, initial encounter: Secondary | ICD-10-CM | POA: Diagnosis not present

## 2024-06-27 MED ORDER — BACLOFEN 10 MG PO TABS: 10.0000 mg | ORAL_TABLET | Freq: Three times a day (TID) | ORAL | 0 refills | Status: AC

## 2024-06-27 NOTE — ED Triage Notes (Signed)
 Patient here today with c/o low back pain since last Friday. Patient has taken Tylenol and Ibuprofen with no relief. No known injury. 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 Standing for long periods of time aggravate it.

## 2024-06-27 NOTE — ED Provider Notes (Signed)
 [facility]    CSN: [id number] Arrival date & time: 06/27/24  9165      History   Chief Complaint Chief Complaint  Patient presents with   Back Pain    HPI Kerry Peters is a 23 y.o. female.   Patient present with generalized low back pain described as stiffness that began on 12/5.  Patient denies any recent falls or injuries.  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  Patient states that standing for long periods of time makes the pain worse.  Patient reports that she has been taking Tylenol and ibuprofen without relief.  Patient denies any numbness, weakness, or pain radiating down her legs.  Denies saddle anesthesia or bowel/bladder incontinence.  The history is provided by the patient and medical records.  Back Pain   Past Medical History:  Diagnosis Date   Medical history non-contributory     There are no active problems to display for this patient.   Past Surgical History:  Procedure Laterality Date   WISDOM TOOTH EXTRACTION      OB History     Gravida  1   Para      Term      Preterm      AB      Living         SAB      IAB      Ectopic      Multiple      Live Births               Home Medications    Prior to Admission medications  Medication Sig Start Date End Date Taking? Authorizing Provider  baclofen (LIORESAL) 10 MG tablet Take 1 tablet (10 mg total) by mouth 3 (three) times daily. 06/27/24  Yes Aiyannah Fayad A, NP  lidocaine (LIDODERM) 5 % Place 1 patch onto the skin daily. Remove & Discard patch within 12 hours or as directed by MD 06/27/24  Yes Johnie Flaming A, NP  triamcinolone (KENALOG) 0.025 % ointment Apply 1 Application topically. 06/08/24  Yes [provider]  cetirizine (ZYRTEC) 10 MG tablet Take 10 mg by mouth daily.    [provider]  medroxyPROGESTERone (DEPO-PROVERA) 150 MG/ML injection  Inject 150 mg into the muscle every 3 (three) months.    [provider]  medroxyPROGESTERone Acetate 150 MG/ML SUSY  08/19/21   [provider]    Family History Family History  Problem Relation Age of Onset   Hypertension Mother    Liver disease Father     Social History Social History[1]   Allergies   Patient has no known allergies.   Review of Systems Review of Systems  Musculoskeletal:  Positive for back pain.   Per HPI  Physical Exam Triage Vital Signs ED Triage Vitals  Encounter Vitals Group     BP 06/27/24 0927 107/75     Girls Systolic BP Percentile --      Girls Diastolic BP Percentile --      Boys Systolic BP Percentile --      Boys Diastolic BP Percentile --      Pulse Rate 06/27/24 0927 69     Resp 06/27/24 0927 16     Temp 06/27/24 0927 98.7 F (37.1 C)     Temp Source 06/27/24 0927 Oral     SpO2 06/27/24 0927 99 %  Weight --      Height --      Head Circumference --      Peak Flow --      Pain Score 06/27/24 0923 5     Pain Loc --      Pain Education --      Exclude from Growth Chart --    No data found.  Updated Vital Signs BP 107/75 (BP Location: Left Arm)   Pulse 69   Temp 98.7 F (37.1 C) (Oral)   Resp 16   LMP 05/26/2024 (Approximate)   SpO2 99%   Breastfeeding No   Visual Acuity Right Eye Distance:   Left Eye Distance:   Bilateral Distance:    Right Eye Near:   Left Eye Near:    Bilateral Near:     Physical Exam Vitals and nursing note reviewed.  Constitutional:      General: She is awake. She is not in acute distress.    Appearance: Normal appearance. She is well-developed and well-groomed. She is not ill-appearing.  Musculoskeletal:     Cervical back: Normal.     Thoracic back: Normal.     Lumbar back: Tenderness present. No swelling, edema, deformity, signs of trauma or bony tenderness. Normal range of motion. Negative right straight leg raise test and negative left straight leg raise test.        Back:     Comments: Tenderness noted to bilateral low back without spinous process tenderness.  Skin:    General: Skin is warm and dry.  Neurological:     Mental Status: She is alert.  Psychiatric:        Behavior: Behavior is cooperative.      UC Treatments / Results  Labs (all labs ordered are listed, but only abnormal results are displayed) Labs Reviewed - No data to display  EKG   Radiology No results found.  Procedures Procedures (including critical care time)  Medications Ordered in UC Medications - No data to display  Initial Impression / Assessment and Plan / UC Course  I have reviewed the triage vital signs and the nursing notes.  Pertinent labs & imaging results that were available during my care of the patient were reviewed by me and considered in my medical decision making (see chart for details).     Patient is overall well-appearing.  Vitals are stable.  Pain likely muscular in nature.  Prescribed baclofen as needed for muscle pain and spasms.  Prescribed lidocaine patches for additional relief of pain.  Recommended continuing with Tylenol and ibuprofen as needed for pain.  Given orthopedic follow-up if needed.  Discussed follow-up and return precautions. Final Clinical Impressions(s) / UC Diagnoses   Final diagnoses:  Strain of lumbar region, initial encounter     Discharge Instructions      As discussed I believe your pain is likely related to a strain in your low back. You can take baclofen every 8 hours as needed for muscle pain and spasms.  This can make you drowsy so do not drive, work, or drink alcohol while taking this. You can also apply lidocaine patch for 12 hours at a time for additional pain relief. You can continue taking Tylenol and ibuprofen as needed for pain. Alternate between ice and heat and do some gentle stretching to help with pain. You can follow-up with [facility] if your pain continues for further evaluation and  management. Otherwise follow-up with your primary care provider or return here as needed.  ED Prescriptions     Medication Sig Dispense Auth. Provider   baclofen (LIORESAL) 10 MG tablet Take 1 tablet (10 mg total) by mouth 3 (three) times daily. 30 each Johnie Flaming A, NP   lidocaine (LIDODERM) 5 % Place 1 patch onto the skin daily. Remove & Discard patch within 12 hours or as directed by MD 30 patch Johnie Flaming LABOR, NP      PDMP not reviewed this encounter.    [1]  Social History Tobacco Use   Smoking status: Never   Smokeless tobacco: Never  Vaping Use   Vaping status: Former  Substance Use Topics   Alcohol use: Not Currently    Comment: Socially   Drug use: Never     Johnie Flaming LABOR, NP 06/27/24 [phone]

## 2024-06-27 NOTE — Discharge Instructions (Addendum)
 As discussed I believe your pain is likely related to a strain in your low back. You can take baclofen every 8 hours as needed for muscle pain and spasms.  This can make you drowsy so do not drive, work, or drink alcohol while taking this. You can also apply lidocaine patch for 12 hours at a time for additional pain relief. You can continue taking Tylenol and ibuprofen as needed for pain. Alternate between ice and heat and do some gentle stretching to help with pain. You can follow-up with [facility] if your pain continues for further evaluation and management. Otherwise follow-up with your primary care provider or return here as needed.
